# Patient Record
Sex: Female | Born: 1995
Health system: Southern US, Community
[De-identification: ages and names within clinical notes are randomized; demographics above are authoritative.]

## PROBLEM LIST (undated history)

## (undated) DIAGNOSIS — J45909 Unspecified asthma, uncomplicated: Secondary | ICD-10-CM

## (undated) DIAGNOSIS — Z86711 Personal history of pulmonary embolism: Secondary | ICD-10-CM

## (undated) HISTORY — PX: TONSILLECTOMY: SUR1361

## (undated) HISTORY — PX: ADENOIDECTOMY: SUR15

---
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
## 2016-05-26 ENCOUNTER — Encounter (HOSPITAL_BASED_OUTPATIENT_CLINIC_OR_DEPARTMENT_OTHER): Payer: Self-pay | Admitting: Emergency Medicine

## 2016-05-26 ENCOUNTER — Emergency Department (HOSPITAL_BASED_OUTPATIENT_CLINIC_OR_DEPARTMENT_OTHER)
Admission: EM | Admit: 2016-05-26 | Discharge: 2016-05-26 | Disposition: A | Discharge status: Home / Self Care | Payer: Medicaid Other | Attending: Emergency Medicine | Admitting: Emergency Medicine

## 2016-05-26 DIAGNOSIS — N3001 Acute cystitis with hematuria: Secondary | ICD-10-CM | POA: Diagnosis not present

## 2016-05-26 DIAGNOSIS — R319 Hematuria, unspecified: Secondary | ICD-10-CM | POA: Diagnosis present

## 2016-05-26 DIAGNOSIS — J45909 Unspecified asthma, uncomplicated: Secondary | ICD-10-CM | POA: Diagnosis not present

## 2016-05-26 LAB — PREGNANCY, URINE: Preg Test, Ur: NEGATIVE

## 2016-05-26 LAB — URINALYSIS, ROUTINE W REFLEX MICROSCOPIC
Bilirubin Urine: NEGATIVE
Glucose, UA: NEGATIVE mg/dL
Hgb urine dipstick: NEGATIVE
Ketones, ur: NEGATIVE mg/dL
NITRITE: POSITIVE — AB
PH: 5.5 (ref 5.0–8.0)
Protein, ur: NEGATIVE mg/dL
SPECIFIC GRAVITY, URINE: 1.015 (ref 1.005–1.030)

## 2016-05-26 LAB — URINALYSIS, MICROSCOPIC (REFLEX)

## 2016-05-26 MED ORDER — CEPHALEXIN 500 MG PO CAPS: 500.0000 mg | ORAL_CAPSULE | Freq: Two times a day (BID) | ORAL | 0 refills | Status: DC

## 2016-05-26 MED ORDER — CEPHALEXIN 250 MG PO CAPS
500.0000 mg | ORAL_CAPSULE | Freq: Once | ORAL | Status: AC
  Administered 2016-05-26: 500 mg via ORAL
  Filled 2016-05-26: qty 2

## 2016-05-26 NOTE — ED Triage Notes (Signed)
Patient states that she is having blood in her urine since Monday. Reports frequency, started on Azo earlier this week

## 2016-05-26 NOTE — Discharge Instructions (Signed)
You may alternate Tylenol 1000 mg every 6 hours as needed for pain and Ibuprofen 800 mg every 8 hours as needed for pain.  Please take Ibuprofen with food. ° °

## 2016-05-26 NOTE — ED Provider Notes (Signed)
TIME SEEN: 3:02 AM  CHIEF COMPLAINT: Dysuria, hematuria  HPI: Patient is a 21 year old female with history of asthma who presents to the emergency department with dysuria, hematuria that started on May 15. No fevers, chills, nausea, vomiting, diarrhea, flank pain. No history of kidney stones. Has had previous urinary tract infection before. No abnormal vaginal bleeding or discharge. Last menstrual period was April 18.  ROS: See HPI Constitutional: no fever  Eyes: no drainage  ENT: no runny nose   Cardiovascular:  no chest pain  Resp: no SOB  GI: no vomiting GU:  dysuria Integumentary: no rash  Allergy: no hives  Musculoskeletal: no leg swelling  Neurological: no slurred speech ROS otherwise negative  PAST MEDICAL HISTORY/PAST SURGICAL HISTORY:  Past Medical History:  Diagnosis Date  . Asthma     MEDICATIONS:  Prior to Admission medications   Medication Sig Start Date End Date Taking? Authorizing Provider  ibuprofen (ADVIL,MOTRIN) 600 MG tablet Take 1 tablet (600 mg total) by mouth every 6 (six) hours as needed for moderate pain. 11/29/14   Lyndal Pulley, MD  MONONESSA 0.25-35 MG-MCG tablet Take 1 tablet by mouth daily. 11/12/14   [provider]    ALLERGIES:  No Known Allergies  SOCIAL HISTORY:  Social History  Substance Use Topics  . Smoking status: Never Smoker  . Smokeless tobacco: Never Used  . Alcohol use No    FAMILY HISTORY: History reviewed. No pertinent family history.  EXAM: BP 125/64 (BP Location: Left Arm)   Pulse 84   Temp 99.4 F (37.4 C) (Oral)   Resp 18   Ht 5\' 2"  (1.575 m)   Wt 159 lb (72.1 kg)   LMP 03/26/2016   SpO2 100%   BMI 29.08 kg/m  CONSTITUTIONAL: Alert and oriented and responds appropriately to questions. Well-appearing; well-nourished HEAD: Normocephalic EYES: Conjunctivae clear, pupils appear equal, EOMI ENT: normal nose; moist mucous membranes NECK: Supple, no meningismus, no nuchal rigidity, no LAD  CARD: RRR; S1  and S2 appreciated; no murmurs, no clicks, no rubs, no gallops RESP: Normal chest excursion without splinting or tachypnea; breath sounds clear and equal bilaterally; no wheezes, no rhonchi, no rales, no hypoxia or respiratory distress, speaking full sentences ABD/GI: Normal bowel sounds; non-distended; soft, non-tender, no rebound, no guarding, no peritoneal signs, no hepatosplenomegaly BACK:  The back appears normal and is non-tender to palpation, there is no CVA tenderness EXT: Normal ROM in all joints; non-tender to palpation; no edema; normal capillary refill; no cyanosis, no calf tenderness or swelling    SKIN: Normal color for age and race; warm; no rash NEURO: Moves all extremities equally PSYCH: The patient's mood and manner are appropriate. Grooming and personal hygiene are appropriate.  MEDICAL DECISION MAKING: Patient here with urinary tract infection. Urine shows nitrites, large leukocytes, too numerous to count white blood cells and many bacteria with rare squamous cells. Urine culture has been sent. Pregnancy test is negative. We'll discharge with Keflex. She is otherwise extremely well appearing. Doubt kidney stone, pyelonephritis. No other systemic symptoms. Abdominal exam is benign.  No previous urine culture here for [place] to compare to.  At this time, I do not feel there is any life-threatening condition present. I have reviewed and discussed all results (EKG, imaging, lab, urine as appropriate) and exam findings with patient/family. I have reviewed nursing notes and appropriate previous records.  I feel the patient is safe to be discharged home without further emergent workup and can continue workup as an outpatient as  needed. Discussed usual and customary return precautions. Patient/family verbalize understanding and are comfortable with this plan.  Outpatient follow-up has been provided if needed. All questions have been answered.     Ward, Layla MawKristen N, DO 05/26/16 [phone]

## 2016-05-29 LAB — URINE CULTURE

## 2016-05-30 ENCOUNTER — Telehealth: Payer: Self-pay | Admitting: *Deleted

## 2016-05-30 NOTE — Telephone Encounter (Signed)
Post ED Visit - Positive Culture Follow-up  Culture report reviewed by antimicrobial stewardship pharmacist:  []  Enzo BiNathan Batchelder, Pharm.D. [x]  Celedonio MiyamotoJeremy Frens, Pharm.D., BCPS AQ-ID []  Garvin FilaMike Maccia, Pharm.D., BCPS []  Georgina PillionElizabeth Martin, [address]Ph[address]D., BCPS []  PinehavenMinh Pham, [address]Pharm[address]D., BCPS, AAHIVP []  Estella HuskMichelle Turner, Pharm.D., BCPS, AAHIVP []  Lysle Pearlachel Rumbarger, PharmD, BCPS []  Casilda Carlsaylor Stone, PharmD, BCPS []  Pollyann SamplesAndy Johnston, PharmD, BCPS  Positive urine culture Treated with Cephalexin, organism sensitive to the same and no further patient follow-up is required at this time.  Virl AxeRobertson, Silvester Reierson [facility] 05/30/2016, 11:03 AM

## 2016-09-14 ENCOUNTER — Encounter (HOSPITAL_BASED_OUTPATIENT_CLINIC_OR_DEPARTMENT_OTHER): Payer: Self-pay | Admitting: Emergency Medicine

## 2016-09-14 DIAGNOSIS — J45909 Unspecified asthma, uncomplicated: Secondary | ICD-10-CM | POA: Insufficient documentation

## 2016-09-14 DIAGNOSIS — A6004 Herpesviral vulvovaginitis: Secondary | ICD-10-CM | POA: Insufficient documentation

## 2016-09-14 DIAGNOSIS — Z79899 Other long term (current) drug therapy: Secondary | ICD-10-CM | POA: Insufficient documentation

## 2016-09-14 DIAGNOSIS — A6003 Herpesviral cervicitis: Secondary | ICD-10-CM | POA: Diagnosis not present

## 2016-09-14 DIAGNOSIS — R102 Pelvic and perineal pain: Secondary | ICD-10-CM | POA: Diagnosis present

## 2016-09-14 DIAGNOSIS — A5901 Trichomonal vulvovaginitis: Secondary | ICD-10-CM | POA: Diagnosis not present

## 2016-09-14 NOTE — ED Triage Notes (Signed)
Pt presents with c/o vaginal sores that she noticed 2 days ago.

## 2016-09-15 ENCOUNTER — Emergency Department (HOSPITAL_BASED_OUTPATIENT_CLINIC_OR_DEPARTMENT_OTHER)
Admission: EM | Admit: 2016-09-15 | Discharge: 2016-09-15 | Disposition: A | Discharge status: Home / Self Care | Payer: Medicaid Other | Attending: Emergency Medicine | Admitting: Emergency Medicine

## 2016-09-15 DIAGNOSIS — N72 Inflammatory disease of cervix uteri: Secondary | ICD-10-CM

## 2016-09-15 DIAGNOSIS — A6004 Herpesviral vulvovaginitis: Secondary | ICD-10-CM

## 2016-09-15 DIAGNOSIS — A5901 Trichomonal vulvovaginitis: Secondary | ICD-10-CM

## 2016-09-15 LAB — PREGNANCY, URINE: Preg Test, Ur: NEGATIVE

## 2016-09-15 LAB — URINALYSIS, MICROSCOPIC (REFLEX)

## 2016-09-15 LAB — URINALYSIS, ROUTINE W REFLEX MICROSCOPIC
Bilirubin Urine: NEGATIVE
GLUCOSE, UA: NEGATIVE mg/dL
KETONES UR: NEGATIVE mg/dL
Nitrite: NEGATIVE
PH: 7 (ref 5.0–8.0)
Protein, ur: NEGATIVE mg/dL
Specific Gravity, Urine: 1.01 (ref 1.005–1.030)

## 2016-09-15 LAB — WET PREP, GENITAL
Sperm: NONE SEEN
Yeast Wet Prep HPF POC: NONE SEEN

## 2016-09-15 MED ORDER — DOXYCYCLINE HYCLATE 100 MG PO TABS
100.0000 mg | ORAL_TABLET | Freq: Once | ORAL | Status: AC
  Administered 2016-09-15: 100 mg via ORAL
  Filled 2016-09-15: qty 1

## 2016-09-15 MED ORDER — LIDOCAINE HCL (PF) 2 % IJ SOLN
INTRAMUSCULAR | Status: AC
  Filled 2016-09-15: qty 2

## 2016-09-15 MED ORDER — VALACYCLOVIR HCL 1 G PO TABS: 1000.0000 mg | ORAL_TABLET | Freq: Two times a day (BID) | ORAL | 0 refills | Status: AC

## 2016-09-15 MED ORDER — CEFTRIAXONE SODIUM 250 MG IJ SOLR
250.0000 mg | Freq: Once | INTRAMUSCULAR | Status: AC
  Administered 2016-09-15: 250 mg via INTRAMUSCULAR
  Filled 2016-09-15: qty 250

## 2016-09-15 MED ORDER — LIDOCAINE HCL 1 % IJ SOLN
INTRAMUSCULAR | Status: AC
  Filled 2016-09-15: qty 10

## 2016-09-15 MED ORDER — METRONIDAZOLE 500 MG PO TABS
2000.0000 mg | ORAL_TABLET | Freq: Once | ORAL | Status: AC
  Administered 2016-09-15: 2000 mg via ORAL
  Filled 2016-09-15: qty 4

## 2016-09-15 MED ORDER — TRAMADOL HCL 50 MG PO TABS: 50.0000 mg | ORAL_TABLET | Freq: Four times a day (QID) | ORAL | 0 refills | Status: DC | PRN

## 2016-09-15 MED ORDER — VALACYCLOVIR HCL 500 MG PO TABS
1000.0000 mg | ORAL_TABLET | Freq: Once | ORAL | Status: AC
  Administered 2016-09-15: 1000 mg via ORAL
  Filled 2016-09-15: qty 2

## 2016-09-15 MED ORDER — DOXYCYCLINE HYCLATE 100 MG PO CAPS: 100.0000 mg | ORAL_CAPSULE | Freq: Two times a day (BID) | ORAL | 0 refills | Status: DC

## 2016-09-15 NOTE — ED Provider Notes (Signed)
[facility]   CSN: [id number] Arrival date & time: 09/14/16  2300     History   Chief Complaint Chief Complaint  Patient presents with  . Vaginal Pain    HPI Judith Robinson is a 21 y.o. female.  Patient presents to the ER for evaluation of vaginal pain. Patient reports she noticed sores at the entrance to her vagina 2 days ago. She has had a clear discharge as well. Patient reports persistent pain and discomfort. No unusual vaginal bleeding. No pelvic pain.      Past Medical History:  Diagnosis Date  . Asthma     There are no active problems to display for this patient.   History reviewed. No pertinent surgical history.  OB History    No data available       Home Medications    Prior to Admission medications   Medication Sig Start Date End Date Taking? Authorizing Provider  cephALEXin (KEFLEX) 500 MG capsule Take 1 capsule (500 mg total) by mouth 2 (two) times daily. 05/26/16   Ward, Layla MawKristen N, DO  doxycycline (VIBRAMYCIN) 100 MG capsule Take 1 capsule (100 mg total) by mouth 2 (two) times daily. 09/15/16   Gilda CreasePollina, Christopher J, MD  ibuprofen (ADVIL,MOTRIN) 600 MG tablet Take 1 tablet (600 mg total) by mouth every 6 (six) hours as needed for moderate pain. 11/29/14   Lyndal PulleyKnott, Daniel, MD  MONONESSA 0.25-35 MG-MCG tablet Take 1 tablet by mouth daily. 11/12/14   [provider]  traMADol (ULTRAM) 50 MG tablet Take 1 tablet (50 mg total) by mouth every 6 (six) hours as needed. 09/15/16   Gilda CreasePollina, Christopher J, MD  valACYclovir (VALTREX) 1000 MG tablet Take 1 tablet (1,000 mg total) by mouth 2 (two) times daily. 09/15/16 09/29/16  Gilda CreasePollina, Christopher J, MD    Family History No family history on file.  Social History Social History  Substance Use Topics  . Smoking status: Never Smoker  . Smokeless tobacco: Never Used  . Alcohol use No     Allergies   Patient has no known allergies.   Review of Systems Review of Systems    Genitourinary: Positive for vaginal discharge and vaginal pain.  All other systems reviewed and are negative.    Physical Exam Updated Vital Signs BP 122/65 (BP Location: Right Arm)   Pulse (!) 108   Temp 99.1 F (37.3 C) (Oral)   Resp 18   Ht 5\' 2"  (1.575 m)   Wt 71.2 kg (157 lb)   LMP 08/22/2016   SpO2 100%   BMI 28.72 kg/m   Physical Exam  Constitutional: She is oriented to person, place, and time. She appears well-developed and well-nourished. No distress.  HENT:  Head: Normocephalic and atraumatic.  Right Ear: Hearing normal.  Left Ear: Hearing normal.  Nose: Nose normal.  Mouth/Throat: Oropharynx is clear and moist and mucous membranes are normal.  Eyes: Pupils are equal, round, and reactive to light. Conjunctivae and EOM are normal.  Neck: Normal range of motion. Neck supple.  Cardiovascular: Regular rhythm, S1 normal and S2 normal.  Exam reveals no gallop and no friction rub.   No murmur heard. Pulmonary/Chest: Effort normal and breath sounds normal. No respiratory distress. She exhibits no tenderness.  Abdominal: Soft. Normal appearance and bowel sounds are normal. There is no hepatosplenomegaly. There is no tenderness. There is no rebound, no guarding, no tenderness at McBurney's point and negative Murphy's sign. No hernia.  Genitourinary: There is rash (ulceration introitus)  on the right labia. There is rash (ulceration introitus) on the left labia. Cervix exhibits motion tenderness, discharge and friability. Right adnexum displays no tenderness. Left adnexum displays no tenderness. There is erythema in the vagina. Vaginal discharge (copius green, thin) found.  Musculoskeletal: Normal range of motion.  Neurological: She is alert and oriented to person, place, and time. She has normal strength. No cranial nerve deficit or sensory deficit. Coordination normal. GCS eye subscore is 4. GCS verbal subscore is 5. GCS motor subscore is 6.  Skin: Skin is warm, dry and intact.  No rash noted. No cyanosis.  Psychiatric: She has a normal mood and affect. Her speech is normal and behavior is normal. Thought content normal.  Nursing note and vitals reviewed.    ED Treatments / Results  Labs (all labs ordered are listed, but only abnormal results are displayed) Labs Reviewed  WET PREP, GENITAL - Abnormal; Notable for the following:       Result Value   Trich, Wet Prep PRESENT (*)    Clue Cells Wet Prep HPF POC PRESENT (*)    WBC, Wet Prep HPF POC MANY (*)    All other components within normal limits  URINALYSIS, ROUTINE W REFLEX MICROSCOPIC - Abnormal; Notable for the following:    APPearance HAZY (*)    Hgb urine dipstick TRACE (*)    Leukocytes, UA MODERATE (*)    All other components within normal limits  URINALYSIS, MICROSCOPIC (REFLEX) - Abnormal; Notable for the following:    Bacteria, UA MANY (*)    Squamous Epithelial / LPF 0-5 (*)    All other components within normal limits  HSV CULTURE AND TYPING  PREGNANCY, URINE  RPR  HIV ANTIBODY (ROUTINE TESTING)  GC/CHLAMYDIA PROBE AMP ([place]) NOT AT [facility]    EKG  EKG Interpretation None       Radiology No results found.  Procedures Procedures (including critical care time)  Medications Ordered in ED Medications  cefTRIAXone (ROCEPHIN) injection 250 mg (not administered)  doxycycline (VIBRA-TABS) tablet 100 mg (not administered)  metroNIDAZOLE (FLAGYL) tablet 2,000 mg (not administered)  valACYclovir (VALTREX) tablet 1,000 mg (not administered)  lidocaine (XYLOCAINE) 2 % injection (not administered)  lidocaine (XYLOCAINE) 1 % (with pres) injection (not administered)     Initial Impression / Assessment and Plan / ED Course  I have reviewed the triage vital signs and the nursing notes.  Pertinent labs & imaging results that were available during my care of the patient were reviewed by me and considered in my medical decision making (see chart for details).     Presents with  complaints of vaginal pain with external sores. Visualization is consistent with herpes genitalis. She has copious vaginal discharge, Trichomonas positive. Treat empirically for GC and chlamydia as well as trichomonas and herpes.  Final Clinical Impressions(s) / ED Diagnoses   Final diagnoses:  Herpes simplex vulvovaginitis  Cervicitis  Trichomonal vaginitis    New Prescriptions New Prescriptions   DOXYCYCLINE (VIBRAMYCIN) 100 MG CAPSULE    Take 1 capsule (100 mg total) by mouth 2 (two) times daily.   TRAMADOL (ULTRAM) 50 MG TABLET    Take 1 tablet (50 mg total) by mouth every 6 (six) hours as needed.   VALACYCLOVIR (VALTREX) 1000 MG TABLET    Take 1 tablet (1,000 mg total) by mouth 2 (two) times daily.     Gilda Crease, MD 09/15/16 [phone]

## 2016-09-15 NOTE — ED Notes (Signed)
Family at bedside. 

## 2016-09-16 LAB — GC/CHLAMYDIA PROBE AMP (~~LOC~~) NOT AT ARMC
CHLAMYDIA, DNA PROBE: NEGATIVE
NEISSERIA GONORRHEA: NEGATIVE

## 2016-09-16 LAB — HIV ANTIBODY (ROUTINE TESTING W REFLEX): HIV Screen 4th Generation wRfx: NONREACTIVE

## 2016-09-16 LAB — RPR: RPR Ser Ql: NONREACTIVE

## 2016-09-17 LAB — HSV CULTURE AND TYPING

## 2019-07-10 ENCOUNTER — Encounter (HOSPITAL_BASED_OUTPATIENT_CLINIC_OR_DEPARTMENT_OTHER): Payer: Self-pay | Admitting: Emergency Medicine

## 2019-07-10 ENCOUNTER — Observation Stay (HOSPITAL_BASED_OUTPATIENT_CLINIC_OR_DEPARTMENT_OTHER)
Admission: EM | Admit: 2019-07-10 | Discharge: 2019-07-11 | Disposition: A | Discharge status: Home / Self Care | Payer: 59 | Attending: Internal Medicine | Admitting: Internal Medicine

## 2019-07-10 ENCOUNTER — Other Ambulatory Visit: Payer: Self-pay

## 2019-07-10 ENCOUNTER — Emergency Department (HOSPITAL_BASED_OUTPATIENT_CLINIC_OR_DEPARTMENT_OTHER): Payer: 59

## 2019-07-10 DIAGNOSIS — Z20822 Contact with and (suspected) exposure to covid-19: Secondary | ICD-10-CM | POA: Diagnosis not present

## 2019-07-10 DIAGNOSIS — Z86711 Personal history of pulmonary embolism: Secondary | ICD-10-CM | POA: Insufficient documentation

## 2019-07-10 DIAGNOSIS — Z8349 Family history of other endocrine, nutritional and metabolic diseases: Secondary | ICD-10-CM | POA: Insufficient documentation

## 2019-07-10 DIAGNOSIS — T783XXA Angioneurotic edema, initial encounter: Secondary | ICD-10-CM | POA: Diagnosis present

## 2019-07-10 DIAGNOSIS — J45909 Unspecified asthma, uncomplicated: Secondary | ICD-10-CM | POA: Insufficient documentation

## 2019-07-10 DIAGNOSIS — Z820 Family history of epilepsy and other diseases of the nervous system: Secondary | ICD-10-CM | POA: Diagnosis not present

## 2019-07-10 DIAGNOSIS — R609 Edema, unspecified: Secondary | ICD-10-CM

## 2019-07-10 DIAGNOSIS — D509 Iron deficiency anemia, unspecified: Secondary | ICD-10-CM | POA: Diagnosis not present

## 2019-07-10 DIAGNOSIS — R221 Localized swelling, mass and lump, neck: Secondary | ICD-10-CM | POA: Insufficient documentation

## 2019-07-10 DIAGNOSIS — Z7952 Long term (current) use of systemic steroids: Secondary | ICD-10-CM | POA: Insufficient documentation

## 2019-07-10 DIAGNOSIS — Y939 Activity, unspecified: Secondary | ICD-10-CM | POA: Insufficient documentation

## 2019-07-10 DIAGNOSIS — Z79899 Other long term (current) drug therapy: Secondary | ICD-10-CM | POA: Insufficient documentation

## 2019-07-10 DIAGNOSIS — X58XXXA Exposure to other specified factors, initial encounter: Secondary | ICD-10-CM | POA: Insufficient documentation

## 2019-07-10 DIAGNOSIS — Z791 Long term (current) use of non-steroidal anti-inflammatories (NSAID): Secondary | ICD-10-CM | POA: Insufficient documentation

## 2019-07-10 DIAGNOSIS — J90 Pleural effusion, not elsewhere classified: Secondary | ICD-10-CM | POA: Diagnosis not present

## 2019-07-10 DIAGNOSIS — F17290 Nicotine dependence, other tobacco product, uncomplicated: Secondary | ICD-10-CM | POA: Diagnosis not present

## 2019-07-10 HISTORY — DX: Personal history of pulmonary embolism: Z86.711

## 2019-07-10 LAB — CBC WITH DIFFERENTIAL/PLATELET
Abs Immature Granulocytes: 0.01 10*3/uL (ref 0.00–0.07)
Basophils Absolute: 0 10*3/uL (ref 0.0–0.1)
Basophils Relative: 1 %
Eosinophils Absolute: 0.3 10*3/uL (ref 0.0–0.5)
Eosinophils Relative: 6 %
HCT: 34.4 % — ABNORMAL LOW (ref 36.0–46.0)
Hemoglobin: 9.9 g/dL — ABNORMAL LOW (ref 12.0–15.0)
Immature Granulocytes: 0 %
Lymphocytes Relative: 42 %
Lymphs Abs: 2 10*3/uL (ref 0.7–4.0)
MCH: 21.5 pg — ABNORMAL LOW (ref 26.0–34.0)
MCHC: 28.8 g/dL — ABNORMAL LOW (ref 30.0–36.0)
MCV: 74.8 fL — ABNORMAL LOW (ref 80.0–100.0)
Monocytes Absolute: 0.5 10*3/uL (ref 0.1–1.0)
Monocytes Relative: 12 %
Neutro Abs: 1.8 10*3/uL (ref 1.7–7.7)
Neutrophils Relative %: 39 %
Platelets: 387 10*3/uL (ref 150–400)
RBC: 4.6 MIL/uL (ref 3.87–5.11)
RDW: 18.8 % — ABNORMAL HIGH (ref 11.5–15.5)
WBC: 4.6 10*3/uL (ref 4.0–10.5)
nRBC: 0 % (ref 0.0–0.2)

## 2019-07-10 LAB — COMPREHENSIVE METABOLIC PANEL
ALT: 13 U/L (ref 0–44)
AST: 17 U/L (ref 15–41)
Albumin: 4 g/dL (ref 3.5–5.0)
Alkaline Phosphatase: 53 U/L (ref 38–126)
Anion gap: 8 (ref 5–15)
BUN: 9 mg/dL (ref 6–20)
CO2: 26 mmol/L (ref 22–32)
Calcium: 9 mg/dL (ref 8.9–10.3)
Chloride: 105 mmol/L (ref 98–111)
Creatinine, Ser: 0.56 mg/dL (ref 0.44–1.00)
GFR calc Af Amer: >60 mL/min (ref >60)
GFR calc non Af Amer: >60 mL/min (ref >60)
Glucose, Bld: 100 mg/dL — ABNORMAL HIGH (ref 70–99)
Potassium: 3.5 mmol/L (ref 3.5–5.1)
Sodium: 139 mmol/L (ref 135–145)
Total Bilirubin: 0.5 mg/dL (ref 0.3–1.2)
Total Protein: 7.6 g/dL (ref 6.5–8.1)

## 2019-07-10 LAB — PREGNANCY, URINE: Preg Test, Ur: NEGATIVE

## 2019-07-10 LAB — SARS CORONAVIRUS 2 BY RT PCR (HOSPITAL ORDER, PERFORMED IN ~~LOC~~ HOSPITAL LAB): SARS Coronavirus 2: NEGATIVE

## 2019-07-10 LAB — TROPONIN I (HIGH SENSITIVITY)
Troponin I (High Sensitivity): 3 ng/L (ref <18)
Troponin I (High Sensitivity): 3 ng/L (ref <18)

## 2019-07-10 MED ORDER — IBUPROFEN 200 MG PO TABS: 400.0000 mg | ORAL_TABLET | Freq: Four times a day (QID) | ORAL | Status: DC | PRN

## 2019-07-10 MED ORDER — CLINDAMYCIN PHOSPHATE 900 MG/50ML IV SOLN
900.0000 mg | Freq: Once | INTRAVENOUS | Status: AC
  Administered 2019-07-10: 900 mg via INTRAVENOUS
  Filled 2019-07-10: qty 50

## 2019-07-10 MED ORDER — ONDANSETRON HCL 4 MG/2ML IJ SOLN: 4.0000 mg | Freq: Four times a day (QID) | INTRAMUSCULAR | Status: DC | PRN

## 2019-07-10 MED ORDER — OXYCODONE HCL 5 MG PO TABS
5.0000 mg | ORAL_TABLET | ORAL | Status: DC | PRN
  Administered 2019-07-11: 5 mg via ORAL
  Filled 2019-07-10: qty 1

## 2019-07-10 MED ORDER — CLINDAMYCIN HCL 300 MG PO CAPS: 600.0000 mg | ORAL_CAPSULE | Freq: Four times a day (QID) | ORAL | Status: DC

## 2019-07-10 MED ORDER — ONDANSETRON HCL 4 MG PO TABS: 4.0000 mg | ORAL_TABLET | Freq: Four times a day (QID) | ORAL | Status: DC | PRN

## 2019-07-10 MED ORDER — DEXAMETHASONE 4 MG PO TABS: 10.0000 mg | ORAL_TABLET | Freq: Two times a day (BID) | ORAL | Status: DC

## 2019-07-10 MED ORDER — DEXAMETHASONE SODIUM PHOSPHATE 10 MG/ML IJ SOLN
20.0000 mg | Freq: Once | INTRAMUSCULAR | Status: AC
  Administered 2019-07-10: 20 mg via INTRAVENOUS
  Filled 2019-07-10: qty 2

## 2019-07-10 MED ORDER — CLINDAMYCIN PHOSPHATE 600 MG/50ML IV SOLN
600.0000 mg | Freq: Four times a day (QID) | INTRAVENOUS | Status: DC
  Administered 2019-07-10 – 2019-07-11 (×4): 600 mg via INTRAVENOUS
  Filled 2019-07-10 (×5): qty 50

## 2019-07-10 MED ORDER — DEXAMETHASONE SODIUM PHOSPHATE 10 MG/ML IJ SOLN
10.0000 mg | Freq: Two times a day (BID) | INTRAMUSCULAR | Status: DC
  Administered 2019-07-11 (×2): 10 mg via INTRAVENOUS
  Filled 2019-07-10 (×2): qty 1

## 2019-07-10 MED ORDER — IOHEXOL 350 MG/ML SOLN
100.0000 mL | Freq: Once | INTRAVENOUS | Status: AC | PRN
  Administered 2019-07-10: 100 mL via INTRAVENOUS

## 2019-07-10 MED ORDER — SODIUM CHLORIDE 0.9% FLUSH
3.0000 mL | Freq: Two times a day (BID) | INTRAVENOUS | Status: DC
  Administered 2019-07-10 – 2019-07-11 (×2): 3 mL via INTRAVENOUS

## 2019-07-10 NOTE — H&P (Signed)
History and Physical    Judith Robinson BDZ:[id number] DOB: 1995/10/21 DOA: 07/10/2019  PCP: [facility] Patient coming from: [facility]  Chief Complaint: left sided neck swelling  HPI: Judith Robinson is a 24 y.o. female with medical history significant of asthma and PE (no longer on [facility]) who presented for left sided neck swelling.  She notes that she woke with this this morning.  Also with discomfort which is achy and worse when she moves her neck.  She has no tooth drainage or pain, no issues on the interior of her mouth.  The area of swelling does not move into the head or around the ear.  She does not remember a bit and has no puncture marks.  She has not been stung.  She is not aware of any food allergies and did not eat anything out of the ordinary.  No new soaps or shampoos, no rash.  [place] and might have been exposed to something in the water.  She did note that it hurt to take a deep breath in, but no cough or other pulmonary issues.   ED Course: In the ED, she was noted microcytic anemia with an MCV of 74.  Otherwise, blood work was normal.  Blood cultures were drawn.  CT of the chest and neck showed no PE.  She had generalized swelling of the left neck which extended into the mediastinum.  She had minimal pleural effusions and patchy, hazy ground glass opacities in both lungs.  ENT (Dr. Suszanne Conners) and CTS (Dr. Morton Peters) were consulted and will see her now that she is at [facility].   Review of Systems: As per HPI otherwise all other systems reviewed and are negative.   Past Medical History:  Diagnosis Date  . Asthma   . History of pulmonary embolism     Past Surgical History:  Procedure Laterality Date  . ADENOIDECTOMY    . TONSILLECTOMY      Social History  reports that she has been smoking e-cigarettes. She has never used smokeless tobacco. She reports that she does not drink alcohol and does not use drugs.  No Known  Allergies  Family History  Problem Relation Age of Onset  . Multiple sclerosis Mother   . Thyroid disease Father        unknown     Prior to Admission medications   Medication Sig Start Date End Date Taking? Authorizing Provider  Albuterol inhaler, 1-2 times per month.                                      Physical Exam: Vitals:   07/10/19 1317 07/10/19 1330 07/10/19 1608 07/10/19 1720  BP: 114/77 120/84 112/73 127/79  Pulse: 82 69 95 89  Resp: 18 16 20 18   Temp:   98.5 F (36.9 C) 98.1 F (36.7 C)  TempSrc:   Oral   SpO2: 100% 100% 100% 99%  Weight: 84.4 kg     Height: 5\' 2"  (1.575 m)       Constitutional: NAD, calm, comfortable, sitting in bed.  Eyes: =lids and conjunctivae normal ENMT: Mucous membranes are moist. Posterior pharynx clear of any exudate or lesions. Normal dentition she has some fillings.  No drainage or mass in mouth.   Neck: She has swelling and tenderness to the left collarbone area to the midline,  no crepitus.  She has no TTP over the parotid or mastoid.  She has no ear drainage.   Respiratory: CTAB, no wheezing Cardiovascular: RR, NR, no murmur.  No swelling of LUE, normal pulses in the radial arteries bilaterally Abdomen: +BS, NT, ND Musculoskeletal: no clubbing / cyanosis. No contractures Skin: no rashes, lesions, ulcers on exposed skin.  Tattoo on mid back Neurologic: Grossly intact, speech intact Psychiatric: Normal judgment and insight. Alert and oriented x 3. Normal mood.    Labs on Admission: I have personally reviewed following labs and imaging studies  CBC: Recent Labs  Lab 07/10/19 1056  WBC 4.6  NEUTROABS 1.8  HGB 9.9*  HCT 34.4*  MCV 74.8*  PLT 387    Basic Metabolic Panel: Recent Labs  Lab 07/10/19 1056  NA 139  K 3.5  CL 105  CO2 26  GLUCOSE 100*  BUN 9  CREATININE 0.56  CALCIUM 9.0    GFR: Estimated Creatinine Clearance: 111.1 mL/min (by C-G formula based on SCr of 0.56 mg/dL).  Liver Function  Tests: Recent Labs  Lab 07/10/19 1056  AST 17  ALT 13  ALKPHOS 53  BILITOT 0.5  PROT 7.6  ALBUMIN 4.0    Urine analysis:    Component Value Date/Time   COLORURINE YELLOW 09/15/2016 0000   APPEARANCEUR HAZY (A) 09/15/2016 0000   LABSPEC 1.010 09/15/2016 0000   PHURINE 7.0 09/15/2016 0000   GLUCOSEU NEGATIVE 09/15/2016 0000   HGBUR TRACE (A) 09/15/2016 0000   BILIRUBINUR NEGATIVE 09/15/2016 0000   KETONESUR NEGATIVE 09/15/2016 0000   PROTEINUR NEGATIVE 09/15/2016 0000   NITRITE NEGATIVE 09/15/2016 0000   LEUKOCYTESUR MODERATE (A) 09/15/2016 0000    Radiological Exams on Admission: CT Soft Tissue Neck W Contrast  Addendum Date: 07/10/2019   ADDENDUM REPORT: 07/10/2019 13:13 ADDENDUM: Study discussed by telephone with Dr. Alvira Monday on 07/10/2019 at 1255 hours. My colleague Dr. Juel Burrow on her Chest CTA read raised the interesting possibility of a left upper extremity venous thrombosis, but these neck CT images confirm patency of the left IJ, the left subclavian vein, visible left axillary vein, and other central veins. And after further discussion with Dr. Dalene Seltzer regarding patient presentation and all clinical features the etiology of the extensive soft tissue edema is unclear. There is no white count or fever. And reportedly the patient takes no medication which could have led to a reaction or toxicity. Still, the entire constellation seems most compatible with a spontaneous angioedema although the location and extent are very unusual. I am happy to discuss further with ENT also should they have any additional ideas. Electronically Signed   By: Odessa Fleming M.D.   On: 07/10/2019 13:13   Result Date: 07/10/2019 CLINICAL DATA:  24 year old female with left neck swelling since waking today. Query Lemierre syndrome. No known injury. History of pulmonary embolus 2 years ago but not on blood thinners at this time. EXAM: CT NECK WITH CONTRAST TECHNIQUE: Multidetector CT imaging of the neck was  performed using the standard protocol following the bolus administration of intravenous contrast. CONTRAST:  OMNIPAQUE IOHEXOL 350 MG/ML SOLN COMPARISON:  CTA chest today reported separately. FINDINGS: Pharynx and larynx: Laryngeal and pharyngeal soft tissue contours remain within normal limits, with no tonsillar or mucosal space hyperenhancement. However, there is conspicuous retropharyngeal fluid or edema without rim enhancement. In the neck this is most pronounced from the C2 through C4 cervical levels (sagittal image 82) although is contiguous with abundant left parapharyngeal, carotid and other deep  soft tissue space edema (series 3, image 68). However, the superior parapharyngeal space on the left remains normal. The right parapharyngeal and deep soft tissue spaces are normal. Salivary glands: Sublingual, submandibular and parotid spaces are spared and appear normal. Thyroid: Thyroid size and enhancement appears within normal limits. There are small enhancing thyroidal veins communicating with the left lower IJ, no discrete thyroid lesion. Lymph nodes: Despite the widespread deep space and trans spatial edema in the left neck (series 3, image 57) there is no associated lymphadenopathy. No enlarged or heterogeneous lymph nodes are identified. Vascular: The left IJ is patent and appears normal throughout its course. The left CCA, left ICA and external carotid also appear normal despite the soft tissue edema or fluid surrounding most of the left carotid space in the neck. Contralateral right neck and other major vascular structures appear patent and normal to the skull base. Limited intracranial: Negative. Visualized orbits: Negative. Mastoids and visualized paranasal sinuses: Clear aside from trace bubbly opacity in the right sphenoid sinus. Tympanic cavities and mastoids are clear. Skeleton: No dental abnormality identified. Normal CT appearance of the cervical spine. There are no dystrophic  calcifications of the longus coli muscles. No osseous abnormality identified. Upper chest: Dedicated chest CTA is reported separately, but note that it appears the widespread deep space soft tissue edema in the left neck also continues into the mediastinum on series 3, image 92. There is mild prevascular space mass effect, but no narrowing of the trachea. Unremarkable visible esophagus. IMPRESSION: Extensive, though nonspecific soft tissue edema and/or fluid tracking throughout the deep spaces of the left lower neck and into the visible mediastinum. There is retropharyngeal space involvement also, although most pronounced in the upper cervical levels (C2 through C3). Negative for Lemierre syndrome, with patent and normal left IJ and left carotid despite edema involving the left carotid space. None of the soft tissue fluid is rim enhancing to suggest abscess. And there is no clear source of this inflammation (e.g. no evidence of tonsillitis, no left thyroid region which might have ruptured, etc). In the lower neck the epicenter of the edema is more supraclavicular than central. Perhaps this is an unusual presentation of Angioedema, such as due to an allergic reaction. If there is neck pain and fever then a Cervical Spine MRI (without and with contrast) would be complementary to exclude an underlying discitis . Also recommend correlation with Chest CTA findings reported separately. Electronically Signed: By: Odessa FlemingH  Hall M.D. On: 07/10/2019 12:21   CT Angio Chest PE W and/or Wo Contrast  Result Date: 07/10/2019 CLINICAL DATA:  Assess for pulmonary embolus. EXAM: CT ANGIOGRAPHY CHEST WITH CONTRAST TECHNIQUE: Multidetector CT imaging of the chest was performed using the standard protocol during bolus administration of intravenous contrast. Multiplanar CT image reconstructions and MIPs were obtained to evaluate the vascular anatomy. CONTRAST:  100mL OMNIPAQUE IOHEXOL 350 MG/ML SOLN COMPARISON:  None. FINDINGS:  Cardiovascular: There is no pulmonary embolus. The aorta is normal. The heart size is normal. There is no pericardial effusion. The study is imaged in the arterial phase therefore there is limited assessment of the venous systems. Mediastinum/Nodes: There is generalized swelling edema of the left neck extend in to the mediastinum. The trachea and esophagus are normal. No definite mediastinal or hilar lymphadenopathy is identified. Lungs/Pleura: Minimal bilateral pleural effusions are identified. Mild atelectasis of lung bases are noted. Minimal patchy hazy ground-glass opacities identified in the bilateral lungs. Upper Abdomen: No acute abnormality. Musculoskeletal: No chest wall abnormality. No  acute or significant osseous findings. Review of the MIP images confirms the above findings. IMPRESSION: 1. No pulmonary embolus. 2. Generalized swelling edema of the left neck extend in to the mediastinum. This study is image in the arterial phase therefore there is limited assessment of the venous systems of the neck and mediastinum. Recommend further evaluation with ultrasound of the upper extremity and neck to out venous thrombosis. 3. Minimal bilateral pleural effusions with mild atelectasis of lung bases. 4. Minimal patchy hazy ground-glass opacities identified in the bilateral lungs. This is nonspecific but can be seen in atypical infection. These results will be called to the ordering clinician or representative by the Radiologist Assistant, and communication documented in the PACS or [employer]. Electronically Signed   By: Sherian Rein M.D.   On: 07/10/2019 12:22     Assessment/Plan  Neck swelling - Discussed with Dr. Suszanne Conners - unclear cause, treat with Abx and steroids for concern for possible infection.  He will see over the weekend if she remains in house - Dexamethasone 20mg  IV given - Dexamethasone 10mg  IV BID - Clindamycin 900mg  IV given - Clindamycin 600mg  IV q 6 hours - Monitor airway,  monitor for improvement  Microcytic Anemia - Check iron studies - consider iron infusion if low  Asthma - PRN albuterol inhaler    DVT prophylaxis: SCDs (patient preference)  Code Status:   Full  Disposition Plan:   Patient is from:  Home  Anticipated DC to:  Home  Anticipated DC date:  07/11/19 if improving  Anticipated DC barriers: Medical improvement  Consults called:  [place], ENT - will see over the weekend if remains in house.  Admission status:  Obs, Med Surg   Severity of Illness: The appropriate patient status for this patient is OBSERVATION. Observation status is judged to be reasonable and necessary in order to provide the required intensity of service to ensure the patient's safety. The patient's presenting symptoms, physical exam findings, and initial radiographic and laboratory data in the context of their medical condition is felt to place them at decreased risk for further clinical deterioration. Furthermore, it is anticipated that the patient will be medically stable for discharge from the hospital within 2 midnights of admission. The following factors support the patient status of observation.   " The patient's presenting symptoms include neck swelling. " The physical exam findings include neck swelling. " The initial radiographic and laboratory data are swelling of the neck to mediastinum.      MD [facility]  How to contact the [facility] Attending or Consulting provider 7A - 7P or covering provider during after hours 7P -7A, for this patient?   1. Check the care team in [facility] and look for a) attending/consulting [facility] provider listed and b) the [facility] team listed 2. Log into [email or web address] and use [place]'s universal password to access. If you do not have the password, please contact the hospital operator. 3. Locate the [facility] provider you are looking for under [facility] and page to a number that you can be directly reached. 4. If you still have  difficulty reaching the provider, please page the [facility] (Director on Call) for the Hospitalists listed on amion for assistance.  07/10/2019, 6:12 PM

## 2019-07-10 NOTE — ED Notes (Signed)
ED Provider at bedside. 

## 2019-07-10 NOTE — Consult Note (Signed)
Reason for Consult: Neck swelling Referring Physician: Alvira Monday, MD  HPI:  Judith Robinson is an 24 y.o. female who noted an acute onset neck discomfort and left lower neck swelling this morning. Her pain is worse when she moves her neck. She denies any dysphagia or odynophagia. No respiratory difficulty. She is not aware of any food allergies and did not eat anything out of the ordinary. She did note that it hurt to take a deep breath in, but no cough or other pulmonary issues.  CT of the chest and neck in the ED showed no PE.  She had generalized swelling of the left neck which extended into the mediastinum.  She had minimal pleural effusions and patchy, hazy ground glass opacities in both lungs.    Past Medical History:  Diagnosis Date  . Asthma   . History of pulmonary embolism     Past Surgical History:  Procedure Laterality Date  . ADENOIDECTOMY    . TONSILLECTOMY      Family History  Problem Relation Age of Onset  . Multiple sclerosis Mother   . Thyroid disease Father        unknown    Social History:  reports that she has been smoking e-cigarettes. She has never used smokeless tobacco. She reports that she does not drink alcohol and does not use drugs.  Allergies: No Known Allergies  Prior to Admission medications   Medication Sig Start Date End Date Taking? Authorizing Provider  cephALEXin (KEFLEX) 500 MG capsule Take 1 capsule (500 mg total) by mouth 2 (two) times daily. Patient not taking: Reported on 07/10/2019 05/26/16   Ward, Layla Maw, DO  doxycycline (VIBRAMYCIN) 100 MG capsule Take 1 capsule (100 mg total) by mouth 2 (two) times daily. Patient not taking: Reported on 07/10/2019 09/15/16   Gilda Crease, MD  ibuprofen (ADVIL,MOTRIN) 600 MG tablet Take 1 tablet (600 mg total) by mouth every 6 (six) hours as needed for moderate pain. Patient not taking: Reported on 07/10/2019 11/29/14   Lyndal Pulley, MD  MONONESSA 0.25-35 MG-MCG tablet Take 1 tablet by mouth  daily. Patient not taking: Reported on 07/10/2019 11/12/14   [provider]  traMADol (ULTRAM) 50 MG tablet Take 1 tablet (50 mg total) by mouth every 6 (six) hours as needed. Patient not taking: Reported on 07/10/2019 09/15/16   Gilda Crease, MD    Medications:  I have reviewed the patient's current medications. Scheduled: . [START ON 07/11/2019] dexamethasone (DECADRON) injection  10 mg Intravenous Q12H  . sodium chloride flush  3 mL Intravenous Q12H   Continuous: . clindamycin (CLEOCIN) IV 600 mg (07/10/19 1955)    Results for orders placed or performed during the hospital encounter of 07/10/19 (from the past 48 hour(s))  Pregnancy, urine     Status: None   Collection Time: 07/10/19 10:55 AM  Result Value Ref Range   Preg Test, Ur NEGATIVE NEGATIVE    Comment:        THE SENSITIVITY OF THIS METHODOLOGY IS >20 mIU/mL. Performed at [facility], [address] [place], [place] [address]   CBC with Differential     Status: Abnormal   Collection Time: 07/10/19 10:56 AM  Result Value Ref Range   WBC 4.6 4.0 - 10.5 K/uL   RBC 4.60 3.87 - 5.11 MIL/uL   Hemoglobin 9.9 (L) 12.0 - 15.0 g/dL   HCT 60.4 (L) 36 - 46 %   MCV 74.8 (L) 80.0 - 100.0 fL  MCH 21.5 (L) 26.0 - 34.0 pg   MCHC 28.8 (L) 30.0 - 36.0 g/dL   RDW 16.118.8 (H) 09.611.5 - 04.515.5 %   Platelets 387 150 - 400 K/uL   nRBC 0.0 0.0 - 0.2 %   Neutrophils Relative % 39 %   Neutro Abs 1.8 1.7 - 7.7 K/uL   Lymphocytes Relative 42 %   Lymphs Abs 2.0 0.7 - 4.0 K/uL   Monocytes Relative 12 %   Monocytes Absolute 0.5 0 - 1 K/uL   Eosinophils Relative 6 %   Eosinophils Absolute 0.3 0 - 0 K/uL   Basophils Relative 1 %   Basophils Absolute 0.0 0 - 0 K/uL   Immature Granulocytes 0 %   Abs Immature Granulocytes 0.01 0.00 - 0.07 K/uL    Comment: Performed at [facility] [place], [address] [place], [place] [address]  Comprehensive metabolic panel     Status: Abnormal   Collection Time: 07/10/19 10:56 AM   Result Value Ref Range   Sodium 139 135 - 145 mmol/L   Potassium 3.5 3.5 - 5.1 mmol/L   Chloride 105 98 - 111 mmol/L   CO2 26 22 - 32 mmol/L   Glucose, Bld 100 (H) 70 - 99 mg/dL    Comment: Glucose reference range applies only to samples taken after fasting for at least 8 hours.   BUN 9 6 - 20 mg/dL   Creatinine, Ser 1.910.56 0.44 - 1.00 mg/dL   Calcium 9.0 8.9 - 47.810.3 mg/dL   Total Protein 7.6 6.5 - 8.1 g/dL   Albumin 4.0 3.5 - 5.0 g/dL   AST 17 15 - 41 U/L   ALT 13 0 - 44 U/L   Alkaline Phosphatase 53 38 - 126 U/L   Total Bilirubin 0.5 0.3 - 1.2 mg/dL   GFR calc non Af Amer >60 >60 mL/min   GFR calc Af Amer >60 >60 mL/min   Anion gap 8 5 - 15    Comment: Performed at [facility] [place], [address] [place], [place] [address]  Troponin I (High Sensitivity)     Status: None   Collection Time: 07/10/19 10:56 AM  Result Value Ref Range   Troponin I (High Sensitivity) 3 <18 ng/L    Comment: (NOTE) Elevated high sensitivity troponin I (hsTnI) values and significant  changes across serial measurements may suggest ACS but many other  chronic and acute conditions are known to elevate hsTnI results.  Refer to the "Links" section for chest pain algorithms and additional  guidance. Performed at [facility] [place], [address] [place], [place] [address]   Troponin I (High Sensitivity)     Status: None   Collection Time: 07/10/19 12:35 PM  Result Value Ref Range   Troponin I (High Sensitivity) 3 <18 ng/L    Comment: (NOTE) Elevated high sensitivity troponin I (hsTnI) values and significant  changes across serial measurements may suggest ACS but many other  chronic and acute conditions are known to elevate hsTnI results.  Refer to the "Links" section for chest pain algorithms and additional  guidance. Performed at [facility] [place], [address] [place], [place] [address]   SARS Coronavirus 2 by RT PCR (hospital order, performed in [facility] lab)  Nasopharyngeal Nasopharyngeal Swab     Status: None   Collection Time: 07/10/19  1:48 PM   Specimen: Nasopharyngeal Swab  Result Value Ref Range   SARS Coronavirus 2 NEGATIVE NEGATIVE  Comment: (NOTE) SARS-CoV-2 target nucleic acids are NOT DETECTED.  The SARS-CoV-2 RNA is generally detectable in upper and lower respiratory specimens during the acute phase of infection. The lowest concentration of SARS-CoV-2 viral copies this assay can detect is 250 copies / mL. A negative result does not preclude SARS-CoV-2 infection and should not be used as the sole basis for treatment or other patient management decisions.  A negative result may occur with improper specimen collection / handling, submission of specimen other than nasopharyngeal swab, presence of viral mutation(s) within the areas targeted by this assay, and inadequate number of viral copies (<250 copies / mL). A negative result must be combined with clinical observations, patient history, and epidemiological information.  Fact Sheet for Patients:   BoilerBrush.com.cy  Fact Sheet for Healthcare Providers: https://pope.com/  This test is not yet approved or  cleared by the [place] FDA and has been authorized for detection and/or diagnosis of SARS-CoV-2 by FDA under an Emergency Use Authorization (EUA).  This EUA will remain in effect (meaning this test can be used) for the duration of the COVID-19 declaration under Section 564(b)(1) of the Act, 21 U.S.C. section 360bbb-3(b)(1), unless the authorization is terminated or revoked sooner.  Performed at [facility], [address], [place], [place] [address]     CT Soft Tissue Neck W Contrast  Addendum Date: 07/10/2019   ADDENDUM REPORT: 07/10/2019 13:13 ADDENDUM: Study discussed by telephone with Dr. Alvira Monday on 07/10/2019 at 1255 hours. My colleague Dr. Juel Burrow on her Chest CTA read raised the interesting  possibility of a left upper extremity venous thrombosis, but these neck CT images confirm patency of the left IJ, the left subclavian vein, visible left axillary vein, and other central veins. And after further discussion with Dr. Dalene Seltzer regarding patient presentation and all clinical features the etiology of the extensive soft tissue edema is unclear. There is no white count or fever. And reportedly the patient takes no medication which could have led to a reaction or toxicity. Still, the entire constellation seems most compatible with a spontaneous angioedema although the location and extent are very unusual. I am happy to discuss further with ENT also should they have any additional ideas. Electronically Signed   By: Odessa Fleming M.D.   On: 07/10/2019 13:13   Result Date: 07/10/2019 CLINICAL DATA:  24 year old female with left neck swelling since waking today. Query Lemierre syndrome. No known injury. History of pulmonary embolus 2 years ago but not on blood thinners at this time. EXAM: CT NECK WITH CONTRAST TECHNIQUE: Multidetector CT imaging of the neck was performed using the standard protocol following the bolus administration of intravenous contrast. CONTRAST:  OMNIPAQUE IOHEXOL 350 MG/ML SOLN COMPARISON:  CTA chest today reported separately. FINDINGS: Pharynx and larynx: Laryngeal and pharyngeal soft tissue contours remain within normal limits, with no tonsillar or mucosal space hyperenhancement. However, there is conspicuous retropharyngeal fluid or edema without rim enhancement. In the neck this is most pronounced from the C2 through C4 cervical levels (sagittal image 82) although is contiguous with abundant left parapharyngeal, carotid and other deep soft tissue space edema (series 3, image 68). However, the superior parapharyngeal space on the left remains normal. The right parapharyngeal and deep soft tissue spaces are normal. Salivary glands: Sublingual, submandibular and parotid spaces are  spared and appear normal. Thyroid: Thyroid size and enhancement appears within normal limits. There are small enhancing thyroidal veins communicating with the left lower IJ, no discrete thyroid lesion.  Lymph nodes: Despite the widespread deep space and trans spatial edema in the left neck (series 3, image 57) there is no associated lymphadenopathy. No enlarged or heterogeneous lymph nodes are identified. Vascular: The left IJ is patent and appears normal throughout its course. The left CCA, left ICA and external carotid also appear normal despite the soft tissue edema or fluid surrounding most of the left carotid space in the neck. Contralateral right neck and other major vascular structures appear patent and normal to the skull base. Limited intracranial: Negative. Visualized orbits: Negative. Mastoids and visualized paranasal sinuses: Clear aside from trace bubbly opacity in the right sphenoid sinus. Tympanic cavities and mastoids are clear. Skeleton: No dental abnormality identified. Normal CT appearance of the cervical spine. There are no dystrophic calcifications of the longus coli muscles. No osseous abnormality identified. Upper chest: Dedicated chest CTA is reported separately, but note that it appears the widespread deep space soft tissue edema in the left neck also continues into the mediastinum on series 3, image 92. There is mild prevascular space mass effect, but no narrowing of the trachea. Unremarkable visible esophagus. IMPRESSION: Extensive, though nonspecific soft tissue edema and/or fluid tracking throughout the deep spaces of the left lower neck and into the visible mediastinum. There is retropharyngeal space involvement also, although most pronounced in the upper cervical levels (C2 through C3). Negative for Lemierre syndrome, with patent and normal left IJ and left carotid despite edema involving the left carotid space. None of the soft tissue fluid is rim enhancing to suggest abscess. And  there is no clear source of this inflammation (e.g. no evidence of tonsillitis, no left thyroid region which might have ruptured, etc). In the lower neck the epicenter of the edema is more supraclavicular than central. Perhaps this is an unusual presentation of Angioedema, such as due to an allergic reaction. If there is neck pain and fever then a Cervical Spine MRI (without and with contrast) would be complementary to exclude an underlying discitis . Also recommend correlation with Chest CTA findings reported separately. Electronically Signed: By: Odessa Fleming M.D. On: 07/10/2019 12:21   CT Angio Chest PE W and/or Wo Contrast  Result Date: 07/10/2019 CLINICAL DATA:  Assess for pulmonary embolus. EXAM: CT ANGIOGRAPHY CHEST WITH CONTRAST TECHNIQUE: Multidetector CT imaging of the chest was performed using the standard protocol during bolus administration of intravenous contrast. Multiplanar CT image reconstructions and MIPs were obtained to evaluate the vascular anatomy. CONTRAST:  OMNIPAQUE IOHEXOL 350 MG/ML SOLN COMPARISON:  None. FINDINGS: Cardiovascular: There is no pulmonary embolus. The aorta is normal. The heart size is normal. There is no pericardial effusion. The study is imaged in the arterial phase therefore there is limited assessment of the venous systems. Mediastinum/Nodes: There is generalized swelling edema of the left neck extend in to the mediastinum. The trachea and esophagus are normal. No definite mediastinal or hilar lymphadenopathy is identified. Lungs/Pleura: Minimal bilateral pleural effusions are identified. Mild atelectasis of lung bases are noted. Minimal patchy hazy ground-glass opacities identified in the bilateral lungs. Upper Abdomen: No acute abnormality. Musculoskeletal: No chest wall abnormality. No acute or significant osseous findings. Review of the MIP images confirms the above findings. IMPRESSION: 1. No pulmonary embolus. 2. Generalized swelling edema of the left neck  extend in to the mediastinum. This study is image in the arterial phase therefore there is limited assessment of the venous systems of the neck and mediastinum. Recommend further evaluation with ultrasound of the upper extremity and neck  to out venous thrombosis. 3. Minimal bilateral pleural effusions with mild atelectasis of lung bases. 4. Minimal patchy hazy ground-glass opacities identified in the bilateral lungs. This is nonspecific but can be seen in atypical infection. These results will be called to the ordering clinician or representative by the Radiologist Assistant, and communication documented in the PACS or [employer]. Electronically Signed   By: Sherian Rein M.D.   On: 07/10/2019 12:22   Review of Systems  Constitutional: Negative for fever.  HENT: Positive for congestion. Negative for sore throat.   Eyes: Negative for visual disturbance.  Respiratory: Positive for shortness of breath. Negative for cough.   Cardiovascular: Positive for chest pain.  Gastrointestinal: Negative for abdominal pain, nausea and vomiting.  Genitourinary: Negative for difficulty urinating.  Musculoskeletal: Positive for neck pain. Negative for back pain.  Skin: Negative for rash.  Neurological: Negative for syncope and headaches.   Blood pressure 115/73, pulse 80, temperature 98.1 F (36.7 C), temperature source Oral, resp. rate 16, height 5\' 2"  (1.575 m), weight 84.4 kg, SpO2 98 %. Physical Exam: General: A&Ox3. No acute distress. Eyes: Pupils are equal, round, reactive to light. Extraocular motion is intact.  Ears: Examination of the ears shows normal auricles and external auditory canals bilaterally. Both tympanic membranes are intact.  Nose: Nasal examination shows normal mucosa, septum, turbinates.  Face: Facial examination shows no asymmetry. Palpation of the face elicit no significant tenderness.  Mouth: Oral cavity examination shows no mucosal lacerations. No significant trismus is noted.   Neck: Palpation of the neck reveals no lymphadenopathy or mass. The trachea is midline. The thyroid is not significantly enlarged. The left lower neck is diffusely edematous. Neuro: Cranial nerves 2-12 are all grossly in tact.  Assessment/Plan: Left lower neck and mediastinum soft tissue edema of unknown etiology. - No airway issue at this time. - No obvious ENT infection on exam. - Continue with decadron and clindamycin. - Will follow clinically.  Alizzon Dioguardi W Verdella Laidlaw 07/10/2019, 11:51 PM

## 2019-07-10 NOTE — ED Notes (Signed)
Attempted report.  Nurse unavailable.  Will try back in 10 minutes.

## 2019-07-10 NOTE — ED Notes (Signed)
Hospitalist consult called via [facility], spoke with Cala Bradford.

## 2019-07-10 NOTE — ED Triage Notes (Signed)
Pt here with left neck swelling down into clavicle area since waking up this morning. No injury occurred. States she can feel the swelling pushing into her throat.

## 2019-07-10 NOTE — ED Provider Notes (Signed)
[facility] EMERGENCY DEPARTMENT Provider Note   CSN: [id number] Arrival date & time: 07/10/19  [id number]     History Chief Complaint  Patient presents with  . Neck swelling    Judith Robinson is a 24 y.o. female.  HPI      24 year old female with a history of asthma, pulmonary embolus no longer on anticoagulation, presents with concern for neck swelling and pain as well as chest pain and shortness of breath that began this morning.  Reports woke up this morning with a sensation of swelling, and mild discomfort in the neck that she describes as an ache.  Reports it is worse when she moves her neck.  Reports she has chronic sinus disease related to allergies, but denies fevers, sore throat.  Reports sensation that the swelling of the left side is pressing on her throat, with some increased difficulty swallowing, mild change in voice.  Reports she has dyspnea-both with sensation from throat, but also related to pain that increases with deep breaths in her chest.  No drooling.  Was on OCPs at time of PE previously. Denies IVDU, other drugs or medications with mother out of room for questioning.  Past Medical History:  Diagnosis Date  . Asthma     Patient Active Problem List   Diagnosis Date Noted  . Angioedema 07/10/2019    Past Surgical History:  Procedure Laterality Date  . TONSILLECTOMY       OB History   No obstetric history on file.     History reviewed. No pertinent family history.  Social History   Tobacco Use  . Smoking status: Never Smoker  . Smokeless tobacco: Never Used  Vaping Use  . Vaping Use: Never used  Substance Use Topics  . Alcohol use: No  . Drug use: No    Home Medications Prior to Admission medications   Medication Sig Start Date End Date Taking? Authorizing Provider  cephALEXin (KEFLEX) 500 MG capsule Take 1 capsule (500 mg total) by mouth 2 (two) times daily. 05/26/16   Ward, Layla MawKristen N, DO  doxycycline (VIBRAMYCIN) 100 MG capsule  Take 1 capsule (100 mg total) by mouth 2 (two) times daily. 09/15/16   Gilda CreasePollina, Christopher J, MD  ibuprofen (ADVIL,MOTRIN) 600 MG tablet Take 1 tablet (600 mg total) by mouth every 6 (six) hours as needed for moderate pain. 11/29/14   Lyndal PulleyKnott, Daniel, MD  MONONESSA 0.25-35 MG-MCG tablet Take 1 tablet by mouth daily. 11/12/14   [provider]  traMADol (ULTRAM) 50 MG tablet Take 1 tablet (50 mg total) by mouth every 6 (six) hours as needed. 09/15/16   Gilda CreasePollina, Christopher J, MD    Allergies    Patient has no known allergies.  Review of Systems   Review of Systems  Constitutional: Negative for fever.  HENT: Positive for congestion. Negative for sore throat.   Eyes: Negative for visual disturbance.  Respiratory: Positive for shortness of breath. Negative for cough.   Cardiovascular: Positive for chest pain.  Gastrointestinal: Negative for abdominal pain, nausea and vomiting.  Genitourinary: Negative for difficulty urinating.  Musculoskeletal: Positive for neck pain. Negative for back pain.  Skin: Negative for rash.  Neurological: Negative for syncope and headaches.    Physical Exam Updated Vital Signs BP 120/84   Pulse 69   Temp 98.6 F (37 C) (Oral)   Resp 16   Ht 5\' 2"  (1.575 m)   Wt 84.4 kg   SpO2 100%   BMI 34.02 kg/m   Physical  Exam Vitals and nursing note reviewed.  Constitutional:      General: She is not in acute distress.    Appearance: She is well-developed. She is not diaphoretic.  HENT:     Head: Normocephalic and atraumatic.     Comments: No oropharyngeal swelling Normal phonation No drooling  Eyes:     Conjunctiva/sclera: Conjunctivae normal.  Neck:     Comments: Swelling supraclavicular area with tenderness and extension proximally No erythema, no fluctuance, normal ROM Cardiovascular:     Rate and Rhythm: Normal rate and regular rhythm.     Heart sounds: Normal heart sounds. No murmur heard.  No friction rub. No gallop.   Pulmonary:      Effort: Pulmonary effort is normal. No respiratory distress.     Breath sounds: Normal breath sounds. No stridor. No wheezing or rales.  Abdominal:     General: There is no distension.     Palpations: Abdomen is soft.     Tenderness: There is no abdominal tenderness. There is no guarding.  Musculoskeletal:        General: No tenderness.     Cervical back: Normal range of motion.  Skin:    General: Skin is warm and dry.     Findings: No erythema or rash.  Neurological:     Mental Status: She is alert and oriented to person, place, and time.     ED Results / Procedures / Treatments   Labs (all labs ordered are listed, but only abnormal results are displayed) Labs Reviewed  CBC WITH DIFFERENTIAL/PLATELET - Abnormal; Notable for the following components:      Result Value   Hemoglobin 9.9 (*)    HCT 34.4 (*)    MCV 74.8 (*)    MCH 21.5 (*)    MCHC 28.8 (*)    RDW 18.8 (*)    All other components within normal limits  COMPREHENSIVE METABOLIC PANEL - Abnormal; Notable for the following components:   Glucose, Bld 100 (*)    All other components within normal limits  SARS CORONAVIRUS 2 BY RT PCR (HOSPITAL ORDER, PERFORMED IN [facility])  CULTURE, BLOOD (ROUTINE X 2)  CULTURE, BLOOD (ROUTINE X 2)  PREGNANCY, URINE  TROPONIN I (HIGH SENSITIVITY)  TROPONIN I (HIGH SENSITIVITY)    EKG EKG Interpretation  Date/Time:  Saturday July 10 2019 11:01:56 EDT Ventricular Rate:  69 PR Interval:    QRS Duration: 91 QT Interval:  396 QTC Calculation: 425 R Axis:   80 Text Interpretation: Sinus rhythm No significant change since last tracing Confirmed by Alvira Monday ([id number]) on 07/10/2019 11:44:07 AM   Radiology CT Soft Tissue Neck W Contrast  Addendum Date: 07/10/2019   ADDENDUM REPORT: 07/10/2019 13:13 ADDENDUM: Study discussed by telephone with Dr. Alvira Monday on 07/10/2019 at 1255 hours. My colleague Dr. Juel Burrow on her Chest CTA read raised the interesting possibility  of a left upper extremity venous thrombosis, but these neck CT images confirm patency of the left IJ, the left subclavian vein, visible left axillary vein, and other central veins. And after further discussion with Dr. Dalene Seltzer regarding patient presentation and all clinical features the etiology of the extensive soft tissue edema is unclear. There is no white count or fever. And reportedly the patient takes no medication which could have led to a reaction or toxicity. Still, the entire constellation seems most compatible with a spontaneous angioedema although the location and extent are very unusual. I am happy to discuss further with  ENT also should they have any additional ideas. Electronically Signed   By: Odessa Fleming M.D.   On: 07/10/2019 13:13   Result Date: 07/10/2019 CLINICAL DATA:  24 year old female with left neck swelling since waking today. Query Lemierre syndrome. No known injury. History of pulmonary embolus 2 years ago but not on blood thinners at this time. EXAM: CT NECK WITH CONTRAST TECHNIQUE: Multidetector CT imaging of the neck was performed using the standard protocol following the bolus administration of intravenous contrast. CONTRAST:  OMNIPAQUE IOHEXOL 350 MG/ML SOLN COMPARISON:  CTA chest today reported separately. FINDINGS: Pharynx and larynx: Laryngeal and pharyngeal soft tissue contours remain within normal limits, with no tonsillar or mucosal space hyperenhancement. However, there is conspicuous retropharyngeal fluid or edema without rim enhancement. In the neck this is most pronounced from the C2 through C4 cervical levels (sagittal image 82) although is contiguous with abundant left parapharyngeal, carotid and other deep soft tissue space edema (series 3, image 68). However, the superior parapharyngeal space on the left remains normal. The right parapharyngeal and deep soft tissue spaces are normal. Salivary glands: Sublingual, submandibular and parotid spaces are spared and  appear normal. Thyroid: Thyroid size and enhancement appears within normal limits. There are small enhancing thyroidal veins communicating with the left lower IJ, no discrete thyroid lesion. Lymph nodes: Despite the widespread deep space and trans spatial edema in the left neck (series 3, image 57) there is no associated lymphadenopathy. No enlarged or heterogeneous lymph nodes are identified. Vascular: The left IJ is patent and appears normal throughout its course. The left CCA, left ICA and external carotid also appear normal despite the soft tissue edema or fluid surrounding most of the left carotid space in the neck. Contralateral right neck and other major vascular structures appear patent and normal to the skull base. Limited intracranial: Negative. Visualized orbits: Negative. Mastoids and visualized paranasal sinuses: Clear aside from trace bubbly opacity in the right sphenoid sinus. Tympanic cavities and mastoids are clear. Skeleton: No dental abnormality identified. Normal CT appearance of the cervical spine. There are no dystrophic calcifications of the longus coli muscles. No osseous abnormality identified. Upper chest: Dedicated chest CTA is reported separately, but note that it appears the widespread deep space soft tissue edema in the left neck also continues into the mediastinum on series 3, image 92. There is mild prevascular space mass effect, but no narrowing of the trachea. Unremarkable visible esophagus. IMPRESSION: Extensive, though nonspecific soft tissue edema and/or fluid tracking throughout the deep spaces of the left lower neck and into the visible mediastinum. There is retropharyngeal space involvement also, although most pronounced in the upper cervical levels (C2 through C3). Negative for Lemierre syndrome, with patent and normal left IJ and left carotid despite edema involving the left carotid space. None of the soft tissue fluid is rim enhancing to suggest abscess. And there is no  clear source of this inflammation (e.g. no evidence of tonsillitis, no left thyroid region which might have ruptured, etc). In the lower neck the epicenter of the edema is more supraclavicular than central. Perhaps this is an unusual presentation of Angioedema, such as due to an allergic reaction. If there is neck pain and fever then a Cervical Spine MRI (without and with contrast) would be complementary to exclude an underlying discitis . Also recommend correlation with Chest CTA findings reported separately. Electronically Signed: By: Odessa Fleming M.D. On: 07/10/2019 12:21   CT Angio Chest PE W and/or Wo Contrast  Result  Date: 07/10/2019 CLINICAL DATA:  Assess for pulmonary embolus. EXAM: CT ANGIOGRAPHY CHEST WITH CONTRAST TECHNIQUE: Multidetector CT imaging of the chest was performed using the standard protocol during bolus administration of intravenous contrast. Multiplanar CT image reconstructions and MIPs were obtained to evaluate the vascular anatomy. CONTRAST:  OMNIPAQUE IOHEXOL 350 MG/ML SOLN COMPARISON:  None. FINDINGS: Cardiovascular: There is no pulmonary embolus. The aorta is normal. The heart size is normal. There is no pericardial effusion. The study is imaged in the arterial phase therefore there is limited assessment of the venous systems. Mediastinum/Nodes: There is generalized swelling edema of the left neck extend in to the mediastinum. The trachea and esophagus are normal. No definite mediastinal or hilar lymphadenopathy is identified. Lungs/Pleura: Minimal bilateral pleural effusions are identified. Mild atelectasis of lung bases are noted. Minimal patchy hazy ground-glass opacities identified in the bilateral lungs. Upper Abdomen: No acute abnormality. Musculoskeletal: No chest wall abnormality. No acute or significant osseous findings. Review of the MIP images confirms the above findings. IMPRESSION: 1. No pulmonary embolus. 2. Generalized swelling edema of the left neck extend in to the  mediastinum. This study is image in the arterial phase therefore there is limited assessment of the venous systems of the neck and mediastinum. Recommend further evaluation with ultrasound of the upper extremity and neck to out venous thrombosis. 3. Minimal bilateral pleural effusions with mild atelectasis of lung bases. 4. Minimal patchy hazy ground-glass opacities identified in the bilateral lungs. This is nonspecific but can be seen in atypical infection. These results will be called to the ordering clinician or representative by the Radiologist Assistant, and communication documented in the PACS or [employer]. Electronically Signed   By: Sherian Rein M.D.   On: 07/10/2019 12:22    Procedures .Critical Care Performed by: Alvira Monday, MD Authorized by: Alvira Monday, MD   Critical care provider statement:    Critical care time (minutes):  45   Critical care was time spent personally by me on the following activities:  Discussions with consultants, evaluation of patient's response to treatment, examination of patient, ordering and performing treatments and interventions, ordering and review of laboratory studies, ordering and review of radiographic studies, pulse oximetry, re-evaluation of patient's condition, obtaining history from patient or surrogate and review of old charts   (including critical care time)  Medications Ordered in ED Medications  dexamethasone (DECADRON) injection 10 mg (has no administration in time range)  iohexol (OMNIPAQUE) 350 MG/ML injection 100 mL (100 mLs Intravenous Contrast Given 07/10/19 1142)  dexamethasone (DECADRON) injection 20 mg (20 mg Intravenous Given 07/10/19 1341)  clindamycin (CLEOCIN) IVPB 900 mg (0 mg Intravenous Stopped 07/10/19 1504)    ED Course  I have reviewed the triage vital signs and the nursing notes.  Pertinent labs & imaging results that were available during my care of the patient were reviewed by me and considered in my  medical decision making (see chart for details).    MDM Rules/Calculators/A&P                           24 year old female with a history of asthma, pulmonary embolus no longer on anticoagulation, presents with concern for neck swelling and pain as well as chest pain and shortness of breath that began this morning.  On exam, there is no sign of drooling, oral pharyngeal swelling, no stridor, no notable dysphonia and no sign of airway compromise.   DDx includes infection of deep space  of neck, vascular thrombus, lymphadenopathy.   As she has history of thromboemolism with CP/dyspnea, ordered CT PE study in addition to CT neck with contrast for further evaluation.  Labs show no acute findings.  CT PE study and CT neck shows no evidence of pulmonary embolus or venous thrombus, however shows generalized swelling/edema of the left neck with extension into the mediastinum, with edema of the retropharyngeal space.  Do not suspect this represents abscess or infection both by history, exam and labs.  Suspect idiopathic edema, angioedema of unclear etiology in unusual location.  Discussed with Dr. Suszanne Conners of ENT, who recommends Decadron and empiric clindamycin and will see her in the hospital. Dr. Morton Peters of CT surgery also consulted and will see [facility] at [facility].  Do not suspect she will require surgery.  Dr. Suszanne Conners recommends 20mg  IV decadron, 900mg  IV clindamycin empirically now and 600QID clinda and decadron 10mg  BID.  Will continue to monitor, telemetry appropriate at this time, speaking comfortably, no sign of airway compromise.   Final Clinical Impression(s) / ED Diagnoses Final diagnoses:  Edema, unspecified type  Neck swelling  Angioedema, initial encounter    Rx / DC Orders ED Discharge Orders    None       , MD 07/10/19 1544

## 2019-07-11 DIAGNOSIS — R221 Localized swelling, mass and lump, neck: Secondary | ICD-10-CM | POA: Diagnosis not present

## 2019-07-11 DIAGNOSIS — T783XXA Angioneurotic edema, initial encounter: Principal | ICD-10-CM

## 2019-07-11 LAB — HIV ANTIBODY (ROUTINE TESTING W REFLEX): HIV Screen 4th Generation wRfx: NONREACTIVE

## 2019-07-11 LAB — BASIC METABOLIC PANEL
Anion gap: 8 (ref 5–15)
BUN: 8 mg/dL (ref 6–20)
CO2: 20 mmol/L — ABNORMAL LOW (ref 22–32)
Calcium: 9.1 mg/dL (ref 8.9–10.3)
Chloride: 107 mmol/L (ref 98–111)
Creatinine, Ser: 0.58 mg/dL (ref 0.44–1.00)
GFR calc Af Amer: >60 mL/min (ref >60)
GFR calc non Af Amer: >60 mL/min (ref >60)
Glucose, Bld: 118 mg/dL — ABNORMAL HIGH (ref 70–99)
Potassium: 4 mmol/L (ref 3.5–5.1)
Sodium: 135 mmol/L (ref 135–145)

## 2019-07-11 LAB — PROTIME-INR
INR: 1.2 (ref 0.8–1.2)
Prothrombin Time: 14.3 seconds (ref 11.4–15.2)

## 2019-07-11 LAB — CBC
HCT: 33.5 % — ABNORMAL LOW (ref 36.0–46.0)
Hemoglobin: 10 g/dL — ABNORMAL LOW (ref 12.0–15.0)
MCH: 21.7 pg — ABNORMAL LOW (ref 26.0–34.0)
MCHC: 29.9 g/dL — ABNORMAL LOW (ref 30.0–36.0)
MCV: 72.7 fL — ABNORMAL LOW (ref 80.0–100.0)
Platelets: 409 10*3/uL — ABNORMAL HIGH (ref 150–400)
RBC: 4.61 MIL/uL (ref 3.87–5.11)
RDW: 18.4 % — ABNORMAL HIGH (ref 11.5–15.5)
WBC: 6 10*3/uL (ref 4.0–10.5)
nRBC: 0 % (ref 0.0–0.2)

## 2019-07-11 MED ORDER — ALBUTEROL SULFATE HFA 108 (90 BASE) MCG/ACT IN AERS
2.0000 | INHALATION_SPRAY | Freq: Four times a day (QID) | RESPIRATORY_TRACT | 1 refills | Status: AC | PRN
Start: 2019-07-11 — End: ?

## 2019-07-11 MED ORDER — FERROUS SULFATE 325 (65 FE) MG PO TABS: 325.0000 mg | ORAL_TABLET | Freq: Every day | ORAL | 1 refills | Status: AC

## 2019-07-11 MED ORDER — CLINDAMYCIN HCL 300 MG PO CAPS
300.0000 mg | ORAL_CAPSULE | Freq: Three times a day (TID) | ORAL | 0 refills | Status: AC
Start: 2019-07-11 — End: 2019-07-16

## 2019-07-11 MED ORDER — PREDNISONE 10 MG PO TABS: 10.0000 mg | ORAL_TABLET | Freq: Every day | ORAL | 0 refills | Status: AC

## 2019-07-11 NOTE — Discharge Summary (Addendum)
Physician Discharge Summary  Judith Robinson ZOX:[id number] DOB: 1995/03/28 DOA: 07/10/2019  PCP: Patient, No Pcp Per  Admit date: 07/10/2019 Discharge date: 07/11/2019  Admitted From: Home Disposition:  Home  Discharge Condition:Stable CODE STATUS:FULL Diet recommendation: Heart Healthy  Brief/Interim Summary: HPI: Judith Robinson is a 24 y.o. female with medical history significant of asthma and PE (no longer on [facility]) who presented for left sided neck swelling.  She notes that she woke with this this morning.  Also with discomfort which is achy and worse when she moves her neck.  She has no tooth drainage or pain, no issues on the interior of her mouth.  The area of swelling does not move into the head or around the ear.  She does not remember a bit and has no puncture marks.  She has not been stung.  She is not aware of any food allergies and did not eat anything out of the ordinary.  No new soaps or shampoos, no rash.  [place] and might have been exposed to something in the water.  She did note that it hurt to take a deep breath in, but no cough or other pulmonary issues.   ED Course: In the ED, she was noted microcytic anemia with an MCV of 74.  Otherwise, blood work was normal.  Blood cultures were drawn.  CT of the chest and neck showed no PE.  She had generalized swelling of the left neck which extended into the mediastinum.  She had minimal pleural effusions and patchy, hazy ground glass opacities in both lungs.  ENT (Dr. Suszanne Conners) and CTS (Dr. Morton Peters) were consulted and will see her now that she is at [facility].   Hospital course:  Her hospital course remained stable.  She presented with swelling of the neck with unknown etiology.  She was started on clindamycin and Decadron with significant improvement today.  She does not complain of any pain, swelling has significantly improved, no odynophagia or dysphagia.  She has remained hemodynamically stable.  She is stable   for discharge to home with oral antibiotics and prednisone.  CTCS and  ENT cleared for discharge.  She will follow-up with ENT as an outpatient.  Following problems were addressed during her hospitalization:  Neck swelling - Management as above -Significant improvement today  Microcytic Anemia -Likely associated with menorrhagia -Start on iron supplementation  Asthma - PRN albuterol inhaler   Discharge Diagnoses:  Active Problems:   Angioedema   Neck swelling    Discharge Instructions  Discharge Instructions    Diet - low sodium heart healthy   Complete by: As directed    Discharge instructions   Complete by: As directed    1)Please take prescribed medications as instructed. 2)Follow up with ENT as an outpatient in a week. Name and number of the provider has been attached.   Increase activity slowly   Complete by: As directed      Allergies as of 07/11/2019   No Known Allergies     Medication List    STOP taking these medications   cephALEXin 500 MG capsule Commonly known as: KEFLEX   doxycycline 100 MG capsule Commonly known as: VIBRAMYCIN   ibuprofen 600 MG tablet Commonly known as: ADVIL   MonoNessa 0.25-35 MG-MCG tablet Generic drug: norgestimate-ethinyl estradiol   traMADol 50 MG tablet Commonly known as: ULTRAM     TAKE these medications   albuterol 108 (90 Base) MCG/ACT inhaler Commonly  known as: VENTOLIN HFA Inhale 2 puffs into the lungs every 6 (six) hours as needed for wheezing or shortness of breath.   clindamycin 300 MG capsule Commonly known as: CLEOCIN Take 1 capsule (300 mg total) by mouth 3 (three) times daily for 5 days.   ferrous sulfate 325 (65 FE) MG tablet Take 1 tablet (325 mg total) by mouth daily.   predniSONE 10 MG tablet Commonly known as: DELTASONE Take 1 tablet (10 mg total) by mouth daily. Take 60 mg tomorrow followed by 50 mg for 1 day then 40 mg for 1 day then 30 mg for 1 day then 20 mg for 1 day then 10 mg for 1   day then stop       Follow-up Information    Newman Pies, MD. Schedule an appointment as soon as possible for a visit in 1 week(s).   Specialty: Otolaryngology Contact information: [address] [place] [address] [place] [address] [phone]              No Known Allergies  Consultations:  CTCS,ENT   Procedures/Studies: CT Soft Tissue Neck W Contrast  Addendum Date: 07/10/2019   ADDENDUM REPORT: 07/10/2019 13:13 ADDENDUM: Study discussed by telephone with Dr. Alvira Monday on 07/10/2019 at 1255 hours. My colleague Dr. Juel Burrow on her Chest CTA read raised the interesting possibility of a left upper extremity venous thrombosis, but these neck CT images confirm patency of the left IJ, the left subclavian vein, visible left axillary vein, and other central veins. And after further discussion with Dr. Dalene Seltzer regarding patient presentation and all clinical features the etiology of the extensive soft tissue edema is unclear. There is no white count or fever. And reportedly the patient takes no medication which could have led to a reaction or toxicity. Still, the entire constellation seems most compatible with a spontaneous angioedema although the location and extent are very unusual. I am happy to discuss further with ENT also should they have any additional ideas. Electronically Signed   By: Odessa Fleming M.D.   On: 07/10/2019 13:13   Result Date: 07/10/2019 CLINICAL DATA:  24 year old female with left neck swelling since waking today. Query Lemierre syndrome. No known injury. History of pulmonary embolus 2 years ago but not on blood thinners at this time. EXAM: CT NECK WITH CONTRAST TECHNIQUE: Multidetector CT imaging of the neck was performed using the standard protocol following the bolus administration of intravenous contrast. CONTRAST:  OMNIPAQUE IOHEXOL 350 MG/ML SOLN COMPARISON:  CTA chest today reported separately. FINDINGS: Pharynx and larynx: Laryngeal and pharyngeal soft tissue contours  remain within normal limits, with no tonsillar or mucosal space hyperenhancement. However, there is conspicuous retropharyngeal fluid or edema without rim enhancement. In the neck this is most pronounced from the C2 through C4 cervical levels (sagittal image 82) although is contiguous with abundant left parapharyngeal, carotid and other deep soft tissue space edema (series 3, image 68). However, the superior parapharyngeal space on the left remains normal. The right parapharyngeal and deep soft tissue spaces are normal. Salivary glands: Sublingual, submandibular and parotid spaces are spared and appear normal. Thyroid: Thyroid size and enhancement appears within normal limits. There are small enhancing thyroidal veins communicating with the left lower IJ, no discrete thyroid lesion. Lymph nodes: Despite the widespread deep space and trans spatial edema in the left neck (series 3, image 57) there is no associated lymphadenopathy. No enlarged or heterogeneous lymph nodes are identified. Vascular: The left IJ is patent and  appears normal throughout its course. The left CCA, left ICA and external carotid also appear normal despite the soft tissue edema or fluid surrounding most of the left carotid space in the neck. Contralateral right neck and other major vascular structures appear patent and normal to the skull base. Limited intracranial: Negative. Visualized orbits: Negative. Mastoids and visualized paranasal sinuses: Clear aside from trace bubbly opacity in the right sphenoid sinus. Tympanic cavities and mastoids are clear. Skeleton: No dental abnormality identified. Normal CT appearance of the cervical spine. There are no dystrophic calcifications of the longus coli muscles. No osseous abnormality identified. Upper chest: Dedicated chest CTA is reported separately, but note that it appears the widespread deep space soft tissue edema in the left neck also continues into the mediastinum on series 3, image 92. There  is mild prevascular space mass effect, but no narrowing of the trachea. Unremarkable visible esophagus. IMPRESSION: Extensive, though nonspecific soft tissue edema and/or fluid tracking throughout the deep spaces of the left lower neck and into the visible mediastinum. There is retropharyngeal space involvement also, although most pronounced in the upper cervical levels (C2 through C3). Negative for Lemierre syndrome, with patent and normal left IJ and left carotid despite edema involving the left carotid space. None of the soft tissue fluid is rim enhancing to suggest abscess. And there is no clear source of this inflammation (e.g. no evidence of tonsillitis, no left thyroid region which might have ruptured, etc). In the lower neck the epicenter of the edema is more supraclavicular than central. Perhaps this is an unusual presentation of Angioedema, such as due to an allergic reaction. If there is neck pain and fever then a Cervical Spine MRI (without and with contrast) would be complementary to exclude an underlying discitis . Also recommend correlation with Chest CTA findings reported separately. Electronically Signed: By: Odessa Fleming M.D. On: 07/10/2019 12:21   CT Angio Chest PE W and/or Wo Contrast  Result Date: 07/10/2019 CLINICAL DATA:  Assess for pulmonary embolus. EXAM: CT ANGIOGRAPHY CHEST WITH CONTRAST TECHNIQUE: Multidetector CT imaging of the chest was performed using the standard protocol during bolus administration of intravenous contrast. Multiplanar CT image reconstructions and MIPs were obtained to evaluate the vascular anatomy. CONTRAST:  OMNIPAQUE IOHEXOL 350 MG/ML SOLN COMPARISON:  None. FINDINGS: Cardiovascular: There is no pulmonary embolus. The aorta is normal. The heart size is normal. There is no pericardial effusion. The study is imaged in the arterial phase therefore there is limited assessment of the venous systems. Mediastinum/Nodes: There is generalized swelling edema of the left  neck extend in to the mediastinum. The trachea and esophagus are normal. No definite mediastinal or hilar lymphadenopathy is identified. Lungs/Pleura: Minimal bilateral pleural effusions are identified. Mild atelectasis of lung bases are noted. Minimal patchy hazy ground-glass opacities identified in the bilateral lungs. Upper Abdomen: No acute abnormality. Musculoskeletal: No chest wall abnormality. No acute or significant osseous findings. Review of the MIP images confirms the above findings. IMPRESSION: 1. No pulmonary embolus. 2. Generalized swelling edema of the left neck extend in to the mediastinum. This study is image in the arterial phase therefore there is limited assessment of the venous systems of the neck and mediastinum. Recommend further evaluation with ultrasound of the upper extremity and neck to out venous thrombosis. 3. Minimal bilateral pleural effusions with mild atelectasis of lung bases. 4. Minimal patchy hazy ground-glass opacities identified in the bilateral lungs. This is nonspecific but can be seen in atypical infection. These results will  be called to the ordering clinician or representative by the Radiologist Assistant, and communication documented in the PACS or [employer]. Electronically Signed   By: Sherian Rein M.D.   On: 07/10/2019 12:22      Subjective: Patient seen and examined at the bedside this morning.  Stable for discharge home today.  Discharge Exam: Vitals:   07/10/19 1720 07/10/19 2212  BP: 127/79 115/73  Pulse: 89 80  Resp: 18 16  Temp: 98.1 F (36.7 C) 98.1 F (36.7 C)  SpO2: 99% 98%   Vitals:   07/10/19 1330 07/10/19 1608 07/10/19 1720 07/10/19 2212  BP: 120/84 112/73 127/79 115/73  Pulse: 69 95 89 80  Resp: 16 20 18 16   Temp:  98.5 F (36.9 C) 98.1 F (36.7 C) 98.1 F (36.7 C)  TempSrc:  Oral  Oral  SpO2: 100% 100% 99% 98%  Weight:      Height:        General: Pt is alert, awake, not in acute distress Cardiovascular: RRR, S1/S2  +, no rubs, no gallops Respiratory: CTA bilaterally, no wheezing, no rhonchi Abdominal: Soft, NT, ND, bowel sounds + Extremities: no edema, no cyanosis    The results of significant diagnostics from this hospitalization (including imaging, microbiology, ancillary and laboratory) are listed below for reference.     Microbiology: Recent Results (from the past 240 hour(s))  Blood culture (routine x 2)     Status: None (Preliminary result)   Collection Time: 07/10/19  1:30 PM   Specimen: BLOOD  Result Value Ref Range Status   Specimen Description   Final    BLOOD RIGHT ANTECUBITAL Performed at [facility], [address], [place], [place]    Special Requests   Final    BOTTLES DRAWN AEROBIC ONLY Blood Culture adequate volume Performed at [facility], [address] [place], [place]    Culture   Final    NO GROWTH < 24 HOURS Performed at [facility], [address], [place], [place]    Report Status PENDING  Incomplete  Blood culture (routine x 2)     Status: None (Preliminary result)   Collection Time: 07/10/19  1:35 PM   Specimen: BLOOD LEFT FOREARM  Result Value Ref Range Status   Specimen Description   Final    BLOOD LEFT FOREARM Performed at [facility], [address] [place], [place]    Special Requests   Final    BOTTLES DRAWN AEROBIC AND ANAEROBIC Blood Culture results may not be optimal due to an inadequate volume of blood received in culture bottles Performed at [facility], [address] [place], [place]    Culture   Final    NO GROWTH < 24 HOURS Performed at [facility], [address] [place], [place]    Report Status PENDING  Incomplete  SARS Coronavirus 2 by RT PCR (hospital order, performed in [facility] hospital lab) Nasopharyngeal Nasopharyngeal Swab     Status: None   Collection Time: 07/10/19  1:48 PM   Specimen: Nasopharyngeal Swab   Result Value Ref Range Status   SARS Coronavirus 2 NEGATIVE NEGATIVE Final    Comment: (NOTE) SARS-CoV-2 target nucleic acids are NOT DETECTED.  The SARS-CoV-2 RNA is generally detectable in upper and lower respiratory specimens during the acute phase of infection. The lowest concentration of SARS-CoV-2 viral copies this assay  can detect is 250 copies / mL. A negative result does not preclude SARS-CoV-2 infection and should not be used as the sole basis for treatment or other patient management decisions.  A negative result may occur with improper specimen collection / handling, submission of specimen other than nasopharyngeal swab, presence of viral mutation(s) within the areas targeted by this assay, and inadequate number of viral copies (<250 copies / mL). A negative result must be combined with clinical observations, patient history, and epidemiological information.  Fact Sheet for Patients:   BoilerBrush.com.cy  Fact Sheet for Healthcare Providers: https://pope.com/  This test is not yet approved or  cleared by the [place] FDA and has been authorized for detection and/or diagnosis of SARS-CoV-2 by FDA under an Emergency Use Authorization (EUA).  This EUA will remain in effect (meaning this test can be used) for the duration of the COVID-19 declaration under Section 564(b)(1) of the Act, 21 U.S.C. section 360bbb-3(b)(1), unless the authorization is terminated or revoked sooner.  Performed at [facility], [address] [place], [place] [address]      Labs: BNP (last 3 results) No results for input(s): BNP in the last 8760 hours. Basic Metabolic Panel: Recent Labs  Lab 07/10/19 1056 07/11/19 0319  NA 139 135  K 3.5 4.0  CL 105 107  CO2 26 20*  GLUCOSE 100* 118*  BUN 9 8  CREATININE 0.56 0.58  CALCIUM 9.0 9.1   Liver Function Tests: Recent Labs  Lab 07/10/19 1056  AST 17  ALT 13  ALKPHOS  53  BILITOT 0.5  PROT 7.6  ALBUMIN 4.0   No results for input(s): LIPASE, AMYLASE in the last 168 hours. No results for input(s): AMMONIA in the last 168 hours. CBC: Recent Labs  Lab 07/10/19 1056 07/11/19 0319  WBC 4.6 6.0  NEUTROABS 1.8  --   HGB 9.9* 10.0*  HCT 34.4* 33.5*  MCV 74.8* 72.7*  PLT 387 409*   Cardiac Enzymes: No results for input(s): CKTOTAL, CKMB, CKMBINDEX, TROPONINI in the last 168 hours. BNP: Invalid input(s): POCBNP CBG: No results for input(s): GLUCAP in the last 168 hours. D-Dimer No results for input(s): DDIMER in the last 72 hours. Hgb A1c No results for input(s): HGBA1C in the last 72 hours. Lipid Profile No results for input(s): CHOL, HDL, LDLCALC, TRIG, CHOLHDL, LDLDIRECT in the last 72 hours. Thyroid function studies No results for input(s): TSH, T4TOTAL, T3FREE, THYROIDAB in the last 72 hours.  Invalid input(s): FREET3 Anemia work up No results for input(s): VITAMINB12, FOLATE, FERRITIN, TIBC, IRON, RETICCTPCT in the last 72 hours. Urinalysis    Component Value Date/Time   COLORURINE YELLOW 09/15/2016 0000   APPEARANCEUR HAZY (A) 09/15/2016 0000   LABSPEC 1.010 09/15/2016 0000   PHURINE 7.0 09/15/2016 0000   GLUCOSEU NEGATIVE 09/15/2016 0000   HGBUR TRACE (A) 09/15/2016 0000   BILIRUBINUR NEGATIVE 09/15/2016 0000   KETONESUR NEGATIVE 09/15/2016 0000   PROTEINUR NEGATIVE 09/15/2016 0000   NITRITE NEGATIVE 09/15/2016 0000   LEUKOCYTESUR MODERATE (A) 09/15/2016 0000   Sepsis Labs Invalid input(s): PROCALCITONIN,  WBC,  LACTICIDVEN Microbiology Recent Results (from the past 240 hour(s))  Blood culture (routine x 2)     Status: None (Preliminary result)   Collection Time: 07/10/19  1:30 PM   Specimen: BLOOD  Result Value Ref Range Status   Specimen Description   Final    BLOOD RIGHT ANTECUBITAL Performed at [facility], [address] [place], [place] [address]  Special Requests   Final    BOTTLES DRAWN AEROBIC ONLY  Blood Culture adequate volume Performed at [facility] [place], [address] [place], [place] [address]    Culture   Final    NO GROWTH < 24 HOURS Performed at [facility], [address] [place], [place] [address]    Report Status PENDING  Incomplete  Blood culture (routine x 2)     Status: None (Preliminary result)   Collection Time: 07/10/19  1:35 PM   Specimen: BLOOD LEFT FOREARM  Result Value Ref Range Status   Specimen Description   Final    BLOOD LEFT FOREARM Performed at [facility] [place], [address] [place], [place] [address]    Special Requests   Final    BOTTLES DRAWN AEROBIC AND ANAEROBIC Blood Culture results may not be optimal due to an inadequate volume of blood received in culture bottles Performed at [facility] [place], [address] [place], [place] [address]    Culture   Final    NO GROWTH < 24 HOURS Performed at [facility], [address] [place], [place] [address]    Report Status PENDING  Incomplete  SARS Coronavirus 2 by RT PCR (hospital order, performed in [facility] hospital lab) Nasopharyngeal Nasopharyngeal Swab     Status: None   Collection Time: 07/10/19  1:48 PM   Specimen: Nasopharyngeal Swab  Result Value Ref Range Status   SARS Coronavirus 2 NEGATIVE NEGATIVE Final    Comment: (NOTE) SARS-CoV-2 target nucleic acids are NOT DETECTED.  The SARS-CoV-2 RNA is generally detectable in upper and lower respiratory specimens during the acute phase of infection. The lowest concentration of SARS-CoV-2 viral copies this assay can detect is 250 copies / mL. A negative result does not preclude SARS-CoV-2 infection and should not be used as the sole basis for treatment or other patient management decisions.  A negative result may occur with improper specimen collection / handling, submission of specimen other than nasopharyngeal swab, presence of viral mutation(s) within the areas targeted by this assay, and  inadequate number of viral copies (<250 copies / mL). A negative result must be combined with clinical observations, patient history, and epidemiological information.  Fact Sheet for Patients:   BoilerBrush.com.cyhttps://www.fda.gov/media/136312/download  Fact Sheet for Healthcare Providers: https://pope.com/https://www.fda.gov/media/136313/download  This test is not yet approved or  cleared by the [place] FDA and has been authorized for detection and/or diagnosis of SARS-CoV-2 by FDA under an Emergency Use Authorization (EUA).  This EUA will remain in effect (meaning this test can be used) for the duration of the COVID-19 declaration under Section 564(b)(1) of the Act, 21 U.S.C. section 360bbb-3(b)(1), unless the authorization is terminated or revoked sooner.  Performed at [facility] [place], [address] [place], [place] [address]     Please note: You were cared for by a hospitalist during your hospital stay. Once you are discharged, your primary care physician will handle any further medical issues. Please note that NO REFILLS for any discharge medications will be authorized once you are discharged, as it is imperative that you return to your primary care physician (or establish a relationship with a primary care physician if you do not have one) for your post hospital discharge needs so that they can reassess your need for medications and monitor your lab values.    Time coordinating discharge: 40 minutes  SIGNED:   Burnadette PopAmrit Sophee Mckimmy, MD  [facility] 07/11/2019, 1:11 PM Pager [phone]  If 7PM-7AM, please contact night-coverage [email or web address] Password [email or web address]

## 2019-07-11 NOTE — Consult Note (Signed)
[address]       [place] [address]             [phone]        Najla [facility] Medical Record #[id number] Date of Birth: September 12, 1995  Referring: No ref. provider found Primary Care: Patient, No Pcp Per Primary Cardiologist:No primary care provider on file.  Chief Complaint:    Chief Complaint  Patient presents with  . Neck swelling    History of Present Illness:     Patient examined and CT scan of chest images personally reviewed.                                                                                            24 year old healthy female admitted to the ED for rather sudden onset of swallowing at the base of her left neck extending towards the supraclavicular fossa.  CT scan was reported as showing some edema in the soft tissues extending into the superior mediastinum.  I reviewed the images and not see any significant abnormalities in the superior mediastinum other than an abundance of mediastinal fat.  No evidence of vascular DVT of the neck or arm.  The patient denies any recent history of fever, night sweats, weight loss, sore throat, insect bites to the left arm or shoulder, or trauma.   Patient has been started on Decadron and oral doxycycline and states that she feels the swelling is improved.  On my exam I detect no evidence of abnormality today.                                                                                                                                                             Current Activity/ Functional Status: Not currently employed activity level   Zubrod Score: At the time of surgery this patient's most appropriate activity status/level should be described as: []     0    Normal activity, no symptoms []     1    Restricted in physical strenuous activity but ambulatory, able to do out light work []     2    Ambulatory and capable of self care, unable to do work activities, up and about                  more than 50%  Of the time                            []   3    Only limited self care, in bed greater than 50% of waking hours []     4    Completely disabled, no self care, confined to bed or chair []     5    Moribund  Past Medical History:  Diagnosis Date  . Asthma   . History of pulmonary embolism     Past Surgical History:  Procedure Laterality Date  . ADENOIDECTOMY    . TONSILLECTOMY      Social History   Tobacco Use  Smoking Status Current Every Day Smoker  . Types: E-cigarettes  Smokeless Tobacco Never Used    Social History   Substance and Sexual Activity  Alcohol Use No     No Known Allergies  Current Facility-Administered Medications  Medication Dose Route Frequency Provider Last Rate Last Admin  . clindamycin (CLEOCIN) IVPB 600 mg  600 mg Intravenous Q6H Alvira MondaySchlossman, Erin, MD 100 mL/hr at 07/11/19 0805 600 mg at 07/11/19 0805  . dexamethasone (DECADRON) injection 10 mg  10 mg Intravenous Q12H Charlynne PanderYao, David Hsienta, MD   10 mg at 07/11/19 0804  . ibuprofen (ADVIL) tablet 400 mg  400 mg Oral Q6H PRN Inez CatalinaMullen, Emily B, MD      . ondansetron [facility](ZOFRAN) tablet 4 mg  4 mg Oral Q6H PRN Inez CatalinaMullen, Emily B, MD       Or  . ondansetron [facility](ZOFRAN) injection 4 mg  4 mg Intravenous Q6H PRN Debe CoderMullen, Emily B, MD      . oxyCODONE (Oxy IR/ROXICODONE) immediate release tablet 5 mg  5 mg Oral Q4H PRN Inez CatalinaMullen, Emily B, MD   5 mg at 07/11/19 0813  . sodium chloride flush (NS) 0.9 % injection 3 mL  3 mL Intravenous Q12H Inez CatalinaMullen, Emily B, MD   3 mL at 07/11/19 0806    Medications Prior to Admission  Medication Sig Dispense Refill Last Dose  . cephALEXin (KEFLEX) 500 MG capsule Take 1 capsule (500 mg total) by mouth 2 (two) times daily. (Patient not taking: Reported on 07/10/2019) 14 capsule 0 Not Taking at Unknown time  . doxycycline (VIBRAMYCIN) 100 MG capsule Take 1 capsule (100 mg total) by mouth 2 (two) times daily. (Patient not taking: Reported on 07/10/2019) 20 capsule 0 Not Taking at Unknown  time  . ibuprofen (ADVIL,MOTRIN) 600 MG tablet Take 1 tablet (600 mg total) by mouth every 6 (six) hours as needed for moderate pain. (Patient not taking: Reported on 07/10/2019) 30 tablet 0 Not Taking at Unknown time  . MONONESSA 0.25-35 MG-MCG tablet Take 1 tablet by mouth daily. (Patient not taking: Reported on 07/10/2019)  4 Not Taking at Unknown time  . traMADol (ULTRAM) 50 MG tablet Take 1 tablet (50 mg total) by mouth every 6 (six) hours as needed. (Patient not taking: Reported on 07/10/2019) 15 tablet 0 Not Taking at Unknown time    Family History  Problem Relation Age of Onset  . Multiple sclerosis Mother   . Thyroid disease Father        unknown     Review of Systems:   ROS      Cardiac Review of Systems: Y or  [    ]= no  Chest Pain [    ]  Resting SOB [   ] Exertional SOB  [  ]  Orthopnea [  ]   Pedal Edema [   ]    Palpitations [  ] Syncope  [  ]   Presyncope [   ]  General Review of Systems: [Y] = yes [  ]=no Constitional: recent weight change [  ]; anorexia [  ]; fatigue [  ]; nausea [  ]; night sweats [  ]; fever [  ]; or chills [  ]                                                               Dental: Last Dentist visit: Greater than 1 year  Eye : blurred vision [  ]; diplopia [   ]; vision changes [  ];  Amaurosis fugax[  ]; Resp: cough [  ];  wheezing[  ];  hemoptysis[  ]; shortness of breath[  ]; paroxysmal nocturnal dyspnea[  ]; dyspnea on exertion[  ]; or orthopnea[  ];  GI:  gallstones[  ], vomiting[  ];  dysphagia[  ]; melena[  ];  hematochezia [  ]; heartburn[  ];   Hx of  Colonoscopy[  ]; GU: kidney stones [  ]; hematuria[  ];   dysuria [  ];  nocturia[  ];  history of     obstruction [  ]; urinary frequency [  ]             Skin: rash, swelling[  ];, hair loss[  ];  peripheral edema[  ];  or itching[  ]; Musculosketetal: myalgias[  ];  joint swelling[  ];  joint erythema[  ];  joint pain[  ];  back pain[  ];  Heme/Lymph: bruising[  ];  bleeding[  ];  anemia[  ];    Neuro: TIA[  ];  headaches[  ];  stroke[  ];  vertigo[  ];  seizures[  ];   paresthesias[  ];  difficulty walking[  ];  Psych:depression[  ]; anxiety[  ];  Endocrine: diabetes[  ];  thyroid dysfunction[  ];                Physical Exam: BP 115/73 (BP Location: Left Arm)   Pulse 80   Temp 98.1 F (36.7 C) (Oral)   Resp 16   Ht 5\' 2"  (1.575 m)   Wt 84.4 kg   SpO2 98%   BMI 34.02 kg/m         Exam    General- alert and comfortable    Neck- no JVD, no cervical adenopathy palpable, no carotid bruit   Lungs- clear without rales, wheezes   Cor- regular rate and rhythm, no murmur , gallop   Abdomen- soft, non-tender   Extremities - warm, non-tender, minimal edema   Neuro- oriented, appropriate, no focal weakness    Diagnostic Studies & Laboratory data:     Recent Radiology Findings:   CT Soft Tissue Neck W Contrast  Addendum Date: 07/10/2019   ADDENDUM REPORT: 07/10/2019 13:13 ADDENDUM: Study discussed by telephone with Dr. 09/10/2019 on 07/10/2019 at 1255 hours. My colleague Dr. 09/10/2019 on her Chest CTA read raised the interesting possibility of a left upper extremity venous thrombosis, but these neck CT images confirm patency of the left IJ, the left subclavian vein, visible left axillary vein, and other central veins. And after further discussion with Dr. Juel Burrow regarding patient presentation and all clinical features the etiology of the extensive soft tissue edema is unclear. There is no white count  or fever. And reportedly the patient takes no medication which could have led to a reaction or toxicity. Still, the entire constellation seems most compatible with a spontaneous angioedema although the location and extent are very unusual. I am happy to discuss further with ENT also should they have any additional ideas. Electronically Signed   By: Odessa Fleming M.D.   On: 07/10/2019 13:13   Result Date: 07/10/2019 CLINICAL DATA:  24 year old female with left neck swelling since waking  today. Query Lemierre syndrome. No known injury. History of pulmonary embolus 2 years ago but not on blood thinners at this time. EXAM: CT NECK WITH CONTRAST TECHNIQUE: Multidetector CT imaging of the neck was performed using the standard protocol following the bolus administration of intravenous contrast. CONTRAST:  OMNIPAQUE IOHEXOL 350 MG/ML SOLN COMPARISON:  CTA chest today reported separately. FINDINGS: Pharynx and larynx: Laryngeal and pharyngeal soft tissue contours remain within normal limits, with no tonsillar or mucosal space hyperenhancement. However, there is conspicuous retropharyngeal fluid or edema without rim enhancement. In the neck this is most pronounced from the C2 through C4 cervical levels (sagittal image 82) although is contiguous with abundant left parapharyngeal, carotid and other deep soft tissue space edema (series 3, image 68). However, the superior parapharyngeal space on the left remains normal. The right parapharyngeal and deep soft tissue spaces are normal. Salivary glands: Sublingual, submandibular and parotid spaces are spared and appear normal. Thyroid: Thyroid size and enhancement appears within normal limits. There are small enhancing thyroidal veins communicating with the left lower IJ, no discrete thyroid lesion. Lymph nodes: Despite the widespread deep space and trans spatial edema in the left neck (series 3, image 57) there is no associated lymphadenopathy. No enlarged or heterogeneous lymph nodes are identified. Vascular: The left IJ is patent and appears normal throughout its course. The left CCA, left ICA and external carotid also appear normal despite the soft tissue edema or fluid surrounding most of the left carotid space in the neck. Contralateral right neck and other major vascular structures appear patent and normal to the skull base. Limited intracranial: Negative. Visualized orbits: Negative. Mastoids and visualized paranasal sinuses: Clear aside from trace  bubbly opacity in the right sphenoid sinus. Tympanic cavities and mastoids are clear. Skeleton: No dental abnormality identified. Normal CT appearance of the cervical spine. There are no dystrophic calcifications of the longus coli muscles. No osseous abnormality identified. Upper chest: Dedicated chest CTA is reported separately, but note that it appears the widespread deep space soft tissue edema in the left neck also continues into the mediastinum on series 3, image 92. There is mild prevascular space mass effect, but no narrowing of the trachea. Unremarkable visible esophagus. IMPRESSION: Extensive, though nonspecific soft tissue edema and/or fluid tracking throughout the deep spaces of the left lower neck and into the visible mediastinum. There is retropharyngeal space involvement also, although most pronounced in the upper cervical levels (C2 through C3). Negative for Lemierre syndrome, with patent and normal left IJ and left carotid despite edema involving the left carotid space. None of the soft tissue fluid is rim enhancing to suggest abscess. And there is no clear source of this inflammation (e.g. no evidence of tonsillitis, no left thyroid region which might have ruptured, etc). In the lower neck the epicenter of the edema is more supraclavicular than central. Perhaps this is an unusual presentation of Angioedema, such as due to an allergic reaction. If there is neck pain and fever then a Cervical Spine MRI (  without and with contrast) would be complementary to exclude an underlying discitis . Also recommend correlation with Chest CTA findings reported separately. Electronically Signed: By: Odessa Fleming M.D. On: 07/10/2019 12:21   CT Angio Chest PE W and/or Wo Contrast  Result Date: 07/10/2019 CLINICAL DATA:  Assess for pulmonary embolus. EXAM: CT ANGIOGRAPHY CHEST WITH CONTRAST TECHNIQUE: Multidetector CT imaging of the chest was performed using the standard protocol during bolus administration of  intravenous contrast. Multiplanar CT image reconstructions and MIPs were obtained to evaluate the vascular anatomy. CONTRAST:  OMNIPAQUE IOHEXOL 350 MG/ML SOLN COMPARISON:  None. FINDINGS: Cardiovascular: There is no pulmonary embolus. The aorta is normal. The heart size is normal. There is no pericardial effusion. The study is imaged in the arterial phase therefore there is limited assessment of the venous systems. Mediastinum/Nodes: There is generalized swelling edema of the left neck extend in to the mediastinum. The trachea and esophagus are normal. No definite mediastinal or hilar lymphadenopathy is identified. Lungs/Pleura: Minimal bilateral pleural effusions are identified. Mild atelectasis of lung bases are noted. Minimal patchy hazy ground-glass opacities identified in the bilateral lungs. Upper Abdomen: No acute abnormality. Musculoskeletal: No chest wall abnormality. No acute or significant osseous findings. Review of the MIP images confirms the above findings. IMPRESSION: 1. No pulmonary embolus. 2. Generalized swelling edema of the left neck extend in to the mediastinum. This study is image in the arterial phase therefore there is limited assessment of the venous systems of the neck and mediastinum. Recommend further evaluation with ultrasound of the upper extremity and neck to out venous thrombosis. 3. Minimal bilateral pleural effusions with mild atelectasis of lung bases. 4. Minimal patchy hazy ground-glass opacities identified in the bilateral lungs. This is nonspecific but can be seen in atypical infection. These results will be called to the ordering clinician or representative by the Radiologist Assistant, and communication documented in the PACS or [employer]. Electronically Signed   By: Sherian Rein M.D.   On: 07/10/2019 12:22     I have independently reviewed the above radiologic studies and discussed with the patient   Recent Lab Findings: Lab Results  Component Value Date    WBC 6.0 07/11/2019   HGB 10.0 (L) 07/11/2019   HCT 33.5 (L) 07/11/2019   PLT 409 (H) 07/11/2019   GLUCOSE 118 (H) 07/11/2019   ALT 13 07/10/2019   AST 17 07/10/2019   NA 135 07/11/2019   K 4.0 07/11/2019   CL 107 07/11/2019   CREATININE 0.58 07/11/2019   BUN 8 07/11/2019   CO2 20 (L) 07/11/2019   INR 1.2 07/11/2019      Assessment / Plan:   No evidence of infection or neoplastic process in the mediastinum.  Agree with plan for steroid taper and empiric antibiotics.  No further thoracic surgical follow-up or CT scan of chest would be needed unless her condition changes.        @ 07/11/2019 12:17 PM

## 2019-07-11 NOTE — Plan of Care (Signed)
New care plan 

## 2019-07-15 LAB — CULTURE, BLOOD (ROUTINE X 2)
Culture: NO GROWTH
Culture: NO GROWTH
Special Requests: ADEQUATE

## 2019-09-17 ENCOUNTER — Encounter (HOSPITAL_BASED_OUTPATIENT_CLINIC_OR_DEPARTMENT_OTHER): Payer: Self-pay | Admitting: *Deleted

## 2019-09-17 ENCOUNTER — Other Ambulatory Visit: Payer: Self-pay

## 2019-09-17 ENCOUNTER — Emergency Department (HOSPITAL_BASED_OUTPATIENT_CLINIC_OR_DEPARTMENT_OTHER)
Admission: EM | Admit: 2019-09-17 | Discharge: 2019-09-17 | Disposition: A | Discharge status: Home / Self Care | Payer: 59 | Attending: Emergency Medicine | Admitting: Emergency Medicine

## 2019-09-17 DIAGNOSIS — Z202 Contact with and (suspected) exposure to infections with a predominantly sexual mode of transmission: Secondary | ICD-10-CM | POA: Diagnosis present

## 2019-09-17 DIAGNOSIS — Z711 Person with feared health complaint in whom no diagnosis is made: Secondary | ICD-10-CM

## 2019-09-17 DIAGNOSIS — Z87891 Personal history of nicotine dependence: Secondary | ICD-10-CM | POA: Diagnosis not present

## 2019-09-17 DIAGNOSIS — J45909 Unspecified asthma, uncomplicated: Secondary | ICD-10-CM | POA: Diagnosis not present

## 2019-09-17 MED ORDER — DOXYCYCLINE HYCLATE 100 MG PO CAPS
100.0000 mg | ORAL_CAPSULE | Freq: Two times a day (BID) | ORAL | 0 refills | Status: AC
Start: 2019-09-17 — End: 2019-09-24

## 2019-09-17 MED ORDER — LIDOCAINE HCL (PF) 1 % IJ SOLN
INTRAMUSCULAR | Status: AC
  Filled 2019-09-17: qty 5

## 2019-09-17 MED ORDER — CEFTRIAXONE SODIUM 500 MG IJ SOLR
500.0000 mg | Freq: Once | INTRAMUSCULAR | Status: AC
  Administered 2019-09-17: 500 mg via INTRAMUSCULAR
  Filled 2019-09-17: qty 500

## 2019-09-17 NOTE — ED Provider Notes (Signed)
[facility] EMERGENCY DEPARTMENT Provider Note   CSN: [id number] Arrival date & time: 09/17/19  1442     Judith Robinson is a 24 y.o. female.  HPI   Patient is a 24 year old female with a medical history as noted below.  Patient states she was told by her boyfriend that she needed to "get an STD check".  She states that he tested positive for gonorrhea.  She has no physical complaints at this time.  She states she recently finished her menstrual cycle.  No vaginal discharge, vaginal bleeding, abdominal pain, nausea, vomiting, pelvic pain, dysuria, hematuria.     Past Medical History:  Diagnosis Date  . Asthma   . History of pulmonary embolism     Patient Active Problem List   Diagnosis Date Noted  . Angioedema 07/10/2019  . Neck swelling 07/10/2019    Past Surgical History:  Procedure Laterality Date  . ADENOIDECTOMY    . TONSILLECTOMY       OB History   No obstetric history on file.     Family History  Problem Relation Age of Onset  . Multiple sclerosis Mother   . Thyroid disease Father        unknown    Social History   Tobacco Use  . Smoking status: Former Smoker    Types: E-cigarettes  . Smokeless tobacco: Never Used  Vaping Use  . Vaping Use: Every day  Substance Use Topics  . Alcohol use: No  . Drug use: No    Home Medications Prior to Admission medications   Medication Sig Start Date End Date Taking? Authorizing Provider  albuterol (VENTOLIN HFA) 108 (90 Base) MCG/ACT inhaler Inhale 2 puffs into the lungs every 6 (six) hours as needed for wheezing or shortness of breath. 07/11/19   Burnadette Pop, MD  doxycycline (VIBRAMYCIN) 100 MG capsule Take 1 capsule (100 mg total) by mouth 2 (two) times daily for 7 days. 09/17/19 09/24/19  Placido Sou, PA-C  ferrous sulfate 325 (65 FE) MG tablet Take 1 tablet (325 mg total) by mouth daily. 07/11/19 07/10/20  Burnadette Pop, MD  predniSONE (DELTASONE) 10 MG tablet Take 1 tablet (10 mg total) by mouth  daily. Take 60 mg tomorrow followed by 50 mg for 1 day then 40 mg for 1 day then 30 mg for 1 day then 20 mg for 1 day then 10 mg for 1  day then stop 07/11/19   Burnadette Pop, MD    Allergies    Patient has no known allergies.  Review of Systems   Review of Systems  Constitutional: Negative for chills and fever.  Gastrointestinal: Negative for abdominal pain, diarrhea, nausea and vomiting.  Genitourinary: Negative for dysuria, hematuria, pelvic pain, vaginal bleeding, vaginal discharge and vaginal pain.   Physical Exam Updated Vital Signs BP 122/76 (BP Location: Right Arm)   Pulse (!) 102   Temp 98.2 F (36.8 C) (Oral)   Resp 16   Ht 5\' 2"  (1.575 m)   Wt 79.8 kg   LMP 09/16/2019   SpO2 99%   BMI 32.19 kg/m   Physical Exam Vitals and nursing note reviewed.  Constitutional:      General: She is not in acute distress.    Appearance: Normal appearance. She is well-developed and normal weight. She is not ill-appearing, toxic-appearing or diaphoretic.  HENT:     Head: Normocephalic and atraumatic.     Right Ear: External ear normal.     Left Ear: External ear normal.  Eyes:     General: No scleral icterus.       Right eye: No discharge.        Left eye: No discharge.     Conjunctiva/sclera: Conjunctivae normal.  Neck:     Trachea: No tracheal deviation.  Cardiovascular:     Rate and Rhythm: Normal rate.  Pulmonary:     Effort: Pulmonary effort is normal. No respiratory distress.     Breath sounds: No stridor.  Abdominal:     General: There is no distension.  Musculoskeletal:        General: No swelling or deformity.     Cervical back: Neck supple.  Skin:    General: Skin is warm and dry.     Findings: No rash.  Neurological:     Mental Status: She is alert.     Cranial Nerves: Cranial nerve deficit: no gross deficits.    ED Results / Procedures / Treatments   Labs (all labs ordered are listed, but only abnormal results are displayed) Labs Reviewed   GC/CHLAMYDIA PROBE AMP ([place]) NOT AT [facility]   EKG None  Radiology No results found.  Procedures Procedures (including critical care time)  Medications Ordered in ED Medications  cefTRIAXone (ROCEPHIN) injection 500 mg (has no administration in time range)   ED Course  I have reviewed the triage vital signs and the nursing notes.  Pertinent labs & imaging results that were available during my care of the patient were reviewed by me and considered in my medical decision making (see chart for details).    MDM Rules/Calculators/A&P                          Pt is a 23 y.o. female that presents with a history, physical exam, and ED Clinical Course as noted above.   Patient presents today for STD check.  She is asymptomatic at this time.  Notes her boyfriend recently tested positive for gonorrhea.  Patient states she recently finished her menstrual cycle and believes she is "definitely not pregnant".  I informed the patient that doxycycline is contraindicated in pregnancy and recommended that she take a home pregnancy test if she was concerned.  I offered her a pregnancy test in the emergency department but she declined due to time.  Patient also declined the wet prep and HIV test noting that she does not want to wait on these lab tests.  We discussed the importance of this test in length and she once again declined.  We will have the patient obtain a gonorrhea/chlamydia test via self swab.  We will go ahead and treat with IM Rocephin here in the emergency department.  Will discharge with 1 week of doxycycline.  Patient is hemodynamically stable and in NAD at the time of d/c. Evaluation does not show pathology that would require ongoing emergent intervention or inpatient treatment. I explained the diagnosis to the patient. Patient is comfortable with above plan and is stable for discharge at this time. All questions were answered prior to disposition. Strict return precautions for  returning to the ED were discussed. Encouraged follow up with PCP.    An After Visit Summary was printed and given to the patient.  Patient discharged to home/self care.  Condition at discharge: Stable  Note: Portions of this report may have been transcribed using voice recognition software. Every effort was made to ensure accuracy; however, inadvertent computerized transcription errors may be present.  Final Clinical Impression(s) / ED Diagnoses Final diagnoses:  Concern about STD in female without diagnosis   Rx / DC Orders ED Discharge Orders         Ordered    doxycycline (VIBRAMYCIN) 100 MG capsule  2 times daily        09/17/19 1857           Placido Sou, PA-C 09/17/19 2345    Gwyneth Sprout, MD 09/19/19 2303

## 2019-09-17 NOTE — Discharge Instructions (Addendum)
I am prescribing you an antibiotic called doxycycline.  You are going to take this twice a day for the next 7 days.  Please do not stop taking this medication early.  Please return to the ER if you develop any new or worsening symptoms.  The antibiotic that I have prescribed you is harmful in pregnancy.  If you are concerned that you are pregnant at all, you need to take a home pregnancy test prior to taking this medication.  It was a pleasure to meet you.

## 2019-09-17 NOTE — ED Triage Notes (Signed)
STD exposure

## 2019-09-20 LAB — GC/CHLAMYDIA PROBE AMP (~~LOC~~) NOT AT ARMC
Chlamydia: NEGATIVE
Comment: NEGATIVE
Comment: NORMAL
Neisseria Gonorrhea: POSITIVE — AB

## 2019-10-08 ENCOUNTER — Emergency Department (HOSPITAL_BASED_OUTPATIENT_CLINIC_OR_DEPARTMENT_OTHER): Payer: 59

## 2019-10-08 ENCOUNTER — Emergency Department (HOSPITAL_BASED_OUTPATIENT_CLINIC_OR_DEPARTMENT_OTHER)
Admission: EM | Admit: 2019-10-08 | Discharge: 2019-10-08 | Disposition: A | Discharge status: Home / Self Care | Payer: 59 | Attending: Emergency Medicine | Admitting: Emergency Medicine

## 2019-10-08 ENCOUNTER — Encounter (HOSPITAL_BASED_OUTPATIENT_CLINIC_OR_DEPARTMENT_OTHER): Payer: Self-pay | Admitting: *Deleted

## 2019-10-08 ENCOUNTER — Other Ambulatory Visit: Payer: Self-pay

## 2019-10-08 DIAGNOSIS — R112 Nausea with vomiting, unspecified: Secondary | ICD-10-CM | POA: Insufficient documentation

## 2019-10-08 DIAGNOSIS — R197 Diarrhea, unspecified: Secondary | ICD-10-CM | POA: Diagnosis not present

## 2019-10-08 DIAGNOSIS — Z87891 Personal history of nicotine dependence: Secondary | ICD-10-CM | POA: Insufficient documentation

## 2019-10-08 DIAGNOSIS — R1084 Generalized abdominal pain: Secondary | ICD-10-CM | POA: Insufficient documentation

## 2019-10-08 DIAGNOSIS — J45909 Unspecified asthma, uncomplicated: Secondary | ICD-10-CM | POA: Diagnosis not present

## 2019-10-08 LAB — COMPREHENSIVE METABOLIC PANEL
ALT: 12 U/L (ref 0–44)
AST: 19 U/L (ref 15–41)
Albumin: 4.2 g/dL (ref 3.5–5.0)
Alkaline Phosphatase: 43 U/L (ref 38–126)
Anion gap: 11 (ref 5–15)
BUN: 10 mg/dL (ref 6–20)
CO2: 21 mmol/L — ABNORMAL LOW (ref 22–32)
Calcium: 9 mg/dL (ref 8.9–10.3)
Chloride: 99 mmol/L (ref 98–111)
Creatinine, Ser: 0.64 mg/dL (ref 0.44–1.00)
GFR calc Af Amer: >60 mL/min (ref >60)
GFR calc non Af Amer: >60 mL/min (ref >60)
Glucose, Bld: 112 mg/dL — ABNORMAL HIGH (ref 70–99)
Potassium: 3.3 mmol/L — ABNORMAL LOW (ref 3.5–5.1)
Sodium: 131 mmol/L — ABNORMAL LOW (ref 135–145)
Total Bilirubin: 1 mg/dL (ref 0.3–1.2)
Total Protein: 7.9 g/dL (ref 6.5–8.1)

## 2019-10-08 LAB — URINALYSIS, ROUTINE W REFLEX MICROSCOPIC
Bilirubin Urine: NEGATIVE
Glucose, UA: NEGATIVE mg/dL
Hgb urine dipstick: NEGATIVE
Ketones, ur: 40 mg/dL — AB
Leukocytes,Ua: NEGATIVE
Nitrite: NEGATIVE
Protein, ur: NEGATIVE mg/dL
Specific Gravity, Urine: 1.025 (ref 1.005–1.030)
pH: 6.5 (ref 5.0–8.0)

## 2019-10-08 LAB — CBC
HCT: 28.7 % — ABNORMAL LOW (ref 36.0–46.0)
Hemoglobin: 8.4 g/dL — ABNORMAL LOW (ref 12.0–15.0)
MCH: 20.5 pg — ABNORMAL LOW (ref 26.0–34.0)
MCHC: 29.3 g/dL — ABNORMAL LOW (ref 30.0–36.0)
MCV: 70.2 fL — ABNORMAL LOW (ref 80.0–100.0)
Platelets: 398 10*3/uL (ref 150–400)
RBC: 4.09 MIL/uL (ref 3.87–5.11)
RDW: 17.1 % — ABNORMAL HIGH (ref 11.5–15.5)
WBC: 25.4 10*3/uL — ABNORMAL HIGH (ref 4.0–10.5)
nRBC: 0 % (ref 0.0–0.2)

## 2019-10-08 LAB — PREGNANCY, URINE: Preg Test, Ur: NEGATIVE

## 2019-10-08 LAB — LIPASE, BLOOD: Lipase: 22 U/L (ref 11–51)

## 2019-10-08 MED ORDER — SODIUM CHLORIDE 0.9 % IV BOLUS
1000.0000 mL | Freq: Once | INTRAVENOUS | Status: AC
  Administered 2019-10-08: 1000 mL via INTRAVENOUS

## 2019-10-08 MED ORDER — ONDANSETRON 4 MG PO TBDP: 4.0000 mg | ORAL_TABLET | Freq: Three times a day (TID) | ORAL | 0 refills | Status: AC | PRN

## 2019-10-08 MED ORDER — LOPERAMIDE HCL 2 MG PO CAPS: 2.0000 mg | ORAL_CAPSULE | Freq: Four times a day (QID) | ORAL | 0 refills | Status: AC | PRN

## 2019-10-08 MED ORDER — IOHEXOL 300 MG/ML  SOLN
100.0000 mL | Freq: Once | INTRAMUSCULAR | Status: AC | PRN
  Administered 2019-10-08: 100 mL via INTRAVENOUS

## 2019-10-08 MED ORDER — MORPHINE SULFATE (PF) 4 MG/ML IV SOLN
4.0000 mg | Freq: Once | INTRAVENOUS | Status: AC
  Administered 2019-10-08: 4 mg via INTRAVENOUS
  Filled 2019-10-08: qty 1

## 2019-10-08 MED ORDER — ONDANSETRON HCL 4 MG/2ML IJ SOLN
4.0000 mg | Freq: Once | INTRAMUSCULAR | Status: AC
  Administered 2019-10-08: 4 mg via INTRAVENOUS
  Filled 2019-10-08: qty 2

## 2019-10-08 NOTE — Discharge Instructions (Signed)
You were seen in the emergency department for crampy abdominal pain nausea vomiting and diarrhea.  Your blood work showed that your infection cell count was high.  Your CAT scan showed evidence of inflammation in your bowels.  They also showed dilation of one of your fallopian tubes.  This will need to be followed up with an ultrasound.  We scheduled you for an ultrasound tomorrow morning at 9 AM.  We are prescribing some medications to help with your symptoms.  If you experience high fever or worsening abdominal pain please return to the emergency department.

## 2019-10-08 NOTE — ED Triage Notes (Signed)
Mid abdominal pain since last evening. Vomited x 3, diarrhea throughout the night.

## 2019-10-08 NOTE — ED Notes (Signed)
ED Provider at bedside. 

## 2019-10-08 NOTE — ED Provider Notes (Signed)
[facility] EMERGENCY DEPARTMENT Provider Note   CSN: [id number] Arrival date & time: 10/08/19  1404     History Chief Complaint  Patient presents with  . Abdominal Pain    Judith Robinson is a 24 y.o. female.  She is here with a complaint of generalized abdominal pain along with nausea vomiting and diarrhea that started around 7 PM last night.  She thinks she may have food poisoning.  No fevers or chills no cough no shortness of breath.  Just finished her.  No vaginal discharge.  Has chronic anemia has not noticed any excessive bleeding.  No sick contacts or recent travel.  The history is provided by the patient.  Abdominal Pain Pain location:  Generalized Pain quality: cramping   Pain radiates to:  Does not radiate Pain severity:  Moderate Onset quality:  Gradual Duration:  22 hours Timing:  Constant Progression:  Unchanged Chronicity:  New Context: not recent travel, not sick contacts and not trauma   Relieved by:  Nothing Worsened by:  Nothing Ineffective treatments:  None tried Associated symptoms: diarrhea, nausea and vomiting   Associated symptoms: no chest pain, no constipation, no cough, no dysuria, no fever, no hematemesis, no hematochezia, no hematuria, no melena, no shortness of breath, no sore throat and no vaginal discharge        Past Medical History:  Diagnosis Date  . Asthma   . History of pulmonary embolism     Patient Active Problem List   Diagnosis Date Noted  . Angioedema 07/10/2019  . Neck swelling 07/10/2019    Past Surgical History:  Procedure Laterality Date  . ADENOIDECTOMY    . TONSILLECTOMY       OB History   No obstetric history on file.     Family History  Problem Relation Age of Onset  . Multiple sclerosis Mother   . Thyroid disease Father        unknown    Social History   Tobacco Use  . Smoking status: Former Smoker    Types: E-cigarettes  . Smokeless tobacco: Never Used  Vaping Use  . Vaping Use: Every  day  Substance Use Topics  . Alcohol use: No  . Drug use: No    Home Medications Prior to Admission medications   Medication Sig Start Date End Date Taking? Authorizing Provider  albuterol (VENTOLIN HFA) 108 (90 Base) MCG/ACT inhaler Inhale 2 puffs into the lungs every 6 (six) hours as needed for wheezing or shortness of breath. 07/11/19  Yes Burnadette Pop, MD  ferrous sulfate 325 (65 FE) MG tablet Take 1 tablet (325 mg total) by mouth daily. 07/11/19 07/10/20 Yes Burnadette Pop, MD  predniSONE (DELTASONE) 10 MG tablet Take 1 tablet (10 mg total) by mouth daily. Take 60 mg tomorrow followed by 50 mg for 1 day then 40 mg for 1 day then 30 mg for 1 day then 20 mg for 1 day then 10 mg for 1  day then stop 07/11/19   Burnadette Pop, MD    Allergies    Patient has no known allergies.  Review of Systems   Review of Systems  Constitutional: Negative for fever.  HENT: Negative for sore throat.   Eyes: Negative for visual disturbance.  Respiratory: Negative for cough and shortness of breath.   Cardiovascular: Negative for chest pain.  Gastrointestinal: Positive for abdominal pain, diarrhea, nausea and vomiting. Negative for constipation, hematemesis, hematochezia and melena.  Genitourinary: Negative for dysuria, hematuria and vaginal discharge.  Musculoskeletal: Negative for back pain.  Skin: Negative for rash.  Neurological: Negative for headaches.    Physical Exam Updated Vital Signs BP 134/74   Pulse (!) 105   Temp 98.5 F (36.9 C) (Oral)   Resp 14   Ht 5\' 2"  (1.575 m)   Wt 79.8 kg   LMP 10/04/2019   SpO2 99%   BMI 32.19 kg/m   Physical Exam Vitals and nursing note reviewed.  Constitutional:      General: She is not in acute distress.    Appearance: Normal appearance. She is well-developed.  HENT:     Head: Normocephalic and atraumatic.  Eyes:     Conjunctiva/sclera: Conjunctivae normal.  Cardiovascular:     Rate and Rhythm: Normal rate and regular rhythm.     Heart  sounds: No murmur heard.   Pulmonary:     Effort: Pulmonary effort is normal. No respiratory distress.     Breath sounds: Normal breath sounds.  Abdominal:     Palpations: Abdomen is soft.     Tenderness: There is generalized abdominal tenderness. There is no guarding or rebound.  Musculoskeletal:        General: No deformity or signs of injury. Normal range of motion.     Cervical back: Neck supple.  Skin:    General: Skin is warm and dry.     Capillary Refill: Capillary refill takes less than 2 seconds.  Neurological:     General: No focal deficit present.     Mental Status: She is alert.     ED Results / Procedures / Treatments   Labs (all labs ordered are listed, but only abnormal results are displayed) Labs Reviewed  COMPREHENSIVE METABOLIC PANEL - Abnormal; Notable for the following components:      Result Value   Sodium 131 (*)    Potassium 3.3 (*)    CO2 21 (*)    Glucose, Bld 112 (*)    All other components within normal limits  CBC - Abnormal; Notable for the following components:   WBC 25.4 (*)    Hemoglobin 8.4 (*)    HCT 28.7 (*)    MCV 70.2 (*)    MCH 20.5 (*)    MCHC 29.3 (*)    RDW 17.1 (*)    All other components within normal limits  URINALYSIS, ROUTINE W REFLEX MICROSCOPIC - Abnormal; Notable for the following components:   Ketones, ur 40 (*)    All other components within normal limits  LIPASE, BLOOD  PREGNANCY, URINE    EKG None  Radiology CT Abdomen Pelvis W Contrast  Addendum Date: 10/08/2019   ADDENDUM REPORT: 10/08/2019 18:54 ADDENDUM: Correction to report, section on kidneys. There is mild scarring at the lower pole of the right kidney with coarse calcification. There are multiple small right-sided kidney stones measuring up to 5 mm in size. There is no hydronephrosis. Electronically Signed   By: Jasmine PangKim  Fujinaga M.D.   On: 10/08/2019 18:54   Result Date: 10/08/2019 CLINICAL DATA:  Diarrhea vomiting and nausea EXAM: CT ABDOMEN AND PELVIS  WITH CONTRAST TECHNIQUE: Multidetector CT imaging of the abdomen and pelvis was performed using the standard protocol following bolus administration of intravenous contrast. CONTRAST:  100mL OMNIPAQUE IOHEXOL 300 MG/ML  SOLN COMPARISON:  None. FINDINGS: Lower chest: Lung bases demonstrate focal atelectasis at the left base. No pleural effusion. Normal heart size. Hepatobiliary: No focal liver abnormality is seen. No gallstones, gallbladder wall thickening, or biliary dilatation. Pancreas: Unremarkable. No pancreatic  ductal dilatation or surrounding inflammatory changes. Spleen: Normal in size without focal abnormality. Adrenals/Urinary Tract: Adrenal glands are unremarkable. Kidneys are normal, without renal calculi, focal lesion, or hydronephrosis. Bladder is unremarkable. Stomach/Bowel: Stomach is nonenlarged. No dilated small bowel. Possible thickening of distal small bowel loops within the anterior pelvis with hazy edema or soft tissue stranding in the anterior pelvic fat. The appendix is slightly generous in size, measuring up to 7 mm but is otherwise without convincing evidence for appendicitis. Vascular/Lymphatic: Nonaneurysmal aorta. Multiple subcentimeter Peri aortic lymph nodes. Reproductive: Uterus unremarkable. Oblong hypodensity within the right pelvis measuring 2 cm transverse by 8.5 cm in length, along the right aspect of the uterus. The configuration suggests possible dilated fallopian tube. Some increased density values within the tubular structure. There may be smaller appearing similar structure in the left adnexa. Other: No free air or free fluid. Musculoskeletal: No acute or significant osseous findings. IMPRESSION: 1. Mild soft tissue stranding and hazy infiltration of the anterior pelvic fat, potentially centered about nondilated distal small bowel loops which demonstrate questionable mild wall thickening. Consider enteritis. Appendix upper normal in size to slightly enlarged but no other  suspicious inflammatory features. 2. Oblong hypodensity within the right pelvis with morphology suggestive of dilated fallopian tube, slight increased internal density values suggest that it may be complicated by hemorrhage or debris. Loculated adnexal fluid collection could also be considered. There may be smaller similar-appearing structure in the left adnexa. Correlation with pelvic ultrasound may be considered with urgency dependent upon clinical presentation. Electronically Signed: By: Jasmine Pang M.D. On: 10/08/2019 18:00   US PELVIC COMPLETE W TRANSVAGINAL AND TORSION R/O  Result Date: 10/09/2019 CLINICAL DATA:  Right lower quadrant abdominal pain. EXAM: TRANSABDOMINAL AND TRANSVAGINAL ULTRASOUND OF PELVIS DOPPLER ULTRASOUND OF OVARIES TECHNIQUE: Both transabdominal and transvaginal ultrasound examinations of the pelvis were performed. Transabdominal technique was performed for global imaging of the pelvis including uterus, ovaries, adnexal regions, and pelvic cul-de-sac. It was necessary to proceed with endovaginal exam following the transabdominal exam to visualize the endometrium and ovaries. Color and duplex Doppler ultrasound was utilized to evaluate blood flow to the ovaries. COMPARISON:  October 08, 2019. FINDINGS: Uterus Measurements: 7.7 x 6.4 x 5.4 cm = volume: 140 mL. No fibroids or other mass visualized. Endometrium Thickness: 3 mm which is within normal limits. No focal abnormality visualized. Right ovary Measurements: 4.9 x 5.0 x 2.4 cm = volume: 31 mL. Normal appearance/no adnexal mass. Left ovary Measurements: 4.6 x 2.8 x 2.3 cm = volume: 15 mL. Normal appearance/no adnexal mass. Pulsed Doppler evaluation of both ovaries demonstrates normal low-resistance arterial and venous waveforms. Other findings Moderate amount of free fluid is noted in the pelvis, most prominently seen in the right adnexal region. IMPRESSION: Moderate amount of free fluid is noted in the pelvis, most prominently  seen in the right adnexal region. No evidence of ovarian torsion or mass. No other abnormality seen in the pelvis. Electronically Signed   By: Lupita Raider M.D.   On: 10/09/2019 10:37    Procedures Procedures (including critical care time)  Medications Ordered in ED Medications  sodium chloride 0.9 % bolus 1,000 mL (has no administration in time range)  ondansetron (ZOFRAN) injection 4 mg (has no administration in time range)  morphine 4 MG/ML injection 4 mg (has no administration in time range)    ED Course  I have reviewed the triage vital signs and the nursing notes.  Pertinent labs & imaging results that were available  during my care of the patient were reviewed by me and considered in my medical decision making (see chart for details).  Clinical Course as of Oct 08 1108  Fri Oct 08, 2019  4098 Reviewed the results of the patient's lab work and CT results with patient.  Clinically she says she feels a lot better.  She has been tolerating p.o.  Ultrasound not available at this time so patient is in agreement to have an ultrasound done tomorrow to follow-up on the fallopian tube finding.   [MB]    Clinical Course User Index [MB] Terrilee Files, MD   MDM Rules/Calculators/A&P                         This patient complains of generalized abdominal cramping in the setting of nausea vomiting and diarrhea; this involves an extensive number of treatment Options and is a complaint that carries with it a high risk of complications and Morbidity. The differential includes gastroenteritis, obstruction, colitis, renal colic, cholelithiasis, appendicitis, diverticulitis, metabolic derangement  I ordered, reviewed and interpreted labs, which included CBC with markedly elevated white count, low hemoglobin, chemistries with mildly low potassium and bicarb reflecting some volume loss, urine with ketones, pregnancy test negative I ordered medication IV fluids and nausea medication pain  medication with improvement in her symptoms I ordered imaging studies which included CT abdomen and pelvis.  And I independently    visualized and interpreted imaging which showed some thickening of small bowel loops possibly reflecting enteritis.  Radiology also comments upon some dilation of her fallopian tube that will need follow-up Previous records obtained and reviewed in epic, no recent visits  After the interventions stated above, I reevaluated the patient and found patient to be clinically much better.  Tachycardia resolved.  She is tolerating p.o.  Abdomen remains soft without any focal tenderness.  I reviewed the results with her and the need for follow-up ultrasound.  Unfortunately ultrasound is not available at this time and I do not think her exam warrants transfer for emergent ultrasound.  She is comfortable plan for returning tomorrow to get an ultrasound in the morning.  Return instructions discussed.   Final Clinical Impression(s) / ED Diagnoses Final diagnoses:  Nausea vomiting and diarrhea  Generalized abdominal pain    Rx / DC Orders ED Discharge Orders         Ordered    loperamide (IMODIUM) 2 MG capsule  4 times daily PRN        10/08/19 1837    ondansetron (ZOFRAN ODT) 4 MG disintegrating tablet  Every 8 hours PRN        10/08/19 1837    US PELVIC COMPLETE W TRANSVAGINAL AND TORSION R/O        10/08/19 1837           Terrilee Files, MD 10/09/19 1114

## 2019-10-09 ENCOUNTER — Ambulatory Visit (HOSPITAL_BASED_OUTPATIENT_CLINIC_OR_DEPARTMENT_OTHER)
Admission: RE | Admit: 2019-10-09 | Discharge: 2019-10-09 | Disposition: A | Discharge status: Home / Self Care | Payer: 59 | Source: Ambulatory Visit | Attending: Emergency Medicine | Admitting: Emergency Medicine

## 2019-10-09 DIAGNOSIS — R1031 Right lower quadrant pain: Secondary | ICD-10-CM | POA: Diagnosis not present

## 2019-10-09 NOTE — ED Provider Notes (Signed)
Seen yesterday by Dr. Charm Barges.  Had presented for generalized abdominal pain with nausea, vomiting and diarrhea.  Was noted to have leukocytosis of 25.6.  CT imaging showed soft tissue stranding and hazy infiltrate with questionable mild wall thickening.  Consider enteritis.  Appendix upper normal in size but no other inflammatory changes of the appendix to suggest appendicitis.  She did have an oblong hypodensity in the right pelvis suggestive of a dilated fallopian tube.  Had internal densities suggesting debris or hemorrhage.  Could not exclude loculated fluid collection.  They recommended ultrasound for further evaluation  Ultrasound today 10/09/2019 showed free fluid in the pelvis more prominently in the right adnexal region. No evidence ovarian torsion or mass.  No loculated fluid collection to suggest TOA.   Patient does not appear ill on my exam. Discussed with patient her imaging results.  Her nausea, vomiting and diarrhea have resolved.  Still has some mild generalized abdominal pain however patient reiterates this is "all over" is not located to her lower abd quadrants. I discussed with patient additional symptomatic management at home as she has not filled the Rx provider by Dr. Charm Barges last night.  Discussed close follow-up with PCP or return to ED for any worsening or persistent symptoms.   Linwood Dibbles, PA-C 10/09/19 1109    Terrilee Files, MD 10/09/19 1115

## 2020-01-21 ENCOUNTER — Encounter (HOSPITAL_BASED_OUTPATIENT_CLINIC_OR_DEPARTMENT_OTHER): Payer: Self-pay

## 2020-01-21 ENCOUNTER — Emergency Department (HOSPITAL_BASED_OUTPATIENT_CLINIC_OR_DEPARTMENT_OTHER)
Admission: EM | Admit: 2020-01-21 | Discharge: 2020-01-21 | Disposition: A | Discharge status: Home / Self Care | Payer: 59 | Attending: Emergency Medicine | Admitting: Emergency Medicine

## 2020-01-21 ENCOUNTER — Other Ambulatory Visit: Payer: Self-pay

## 2020-01-21 DIAGNOSIS — Z87891 Personal history of nicotine dependence: Secondary | ICD-10-CM | POA: Insufficient documentation

## 2020-01-21 DIAGNOSIS — Y9384 Activity, sleeping: Secondary | ICD-10-CM | POA: Insufficient documentation

## 2020-01-21 DIAGNOSIS — X58XXXA Exposure to other specified factors, initial encounter: Secondary | ICD-10-CM | POA: Insufficient documentation

## 2020-01-21 DIAGNOSIS — T162XXA Foreign body in left ear, initial encounter: Secondary | ICD-10-CM | POA: Insufficient documentation

## 2020-01-21 DIAGNOSIS — R400 Somnolence: Secondary | ICD-10-CM | POA: Insufficient documentation

## 2020-01-21 DIAGNOSIS — J45909 Unspecified asthma, uncomplicated: Secondary | ICD-10-CM | POA: Insufficient documentation

## 2020-01-21 MED ORDER — CIPROFLOXACIN-DEXAMETHASONE 0.3-0.1 % OT SUSP: 4.0000 [drp] | Freq: Two times a day (BID) | OTIC | 0 refills | Status: DC

## 2020-01-21 MED ORDER — NEOMYCIN-POLYMYXIN-HC 3.5-10000-1 OT SUSP: 4.0000 [drp] | Freq: Three times a day (TID) | OTIC | 0 refills | Status: AC

## 2020-01-21 NOTE — ED Notes (Signed)
Earring piece came out.

## 2020-01-21 NOTE — Discharge Instructions (Addendum)
Call your primary care doctor or specialist as discussed in the next 2-3 days.   Return immediately back to the ER if:  Your symptoms worsen within the next 12-24 hours. You develop new symptoms such as new fevers, persistent vomiting, new pain, shortness of breath, or new weakness or numbness, or if you have any other concerns.  

## 2020-01-21 NOTE — ED Provider Notes (Signed)
[facility] EMERGENCY DEPARTMENT Provider Note   CSN: [id number] Arrival date & time: 01/21/20  0954     History Chief Complaint  Patient presents with  . Foreign Body in Ear    Judith Robinson is a 25 y.o. female.  Patient woke up this morning missing her left earring, she felt it inside her ear and noticed that he had fallen inside her ear while she was sleeping.  She tried to get it out by try to flush it out but was unsuccessful and presents to the ER for foreign body in her left ear.  Denies headache fever vomiting cough or diarrhea.        Past Medical History:  Diagnosis Date  . Asthma   . History of pulmonary embolism     Patient Active Problem List   Diagnosis Date Noted  . Angioedema 07/10/2019  . Neck swelling 07/10/2019    Past Surgical History:  Procedure Laterality Date  . ADENOIDECTOMY    . TONSILLECTOMY       OB History   No obstetric history on file.     Family History  Problem Relation Age of Onset  . Multiple sclerosis Mother   . Thyroid disease Father        unknown    Social History   Tobacco Use  . Smoking status: Former Smoker    Types: E-cigarettes  . Smokeless tobacco: Never Used  Vaping Use  . Vaping Use: Every day  Substance Use Topics  . Alcohol use: No  . Drug use: No    Home Medications Prior to Admission medications   Medication Sig Start Date End Date Taking? Authorizing Provider  ciprofloxacin-dexamethasone (CIPRODEX) OTIC suspension Place 4 drops into the left ear 2 (two) times daily for 7 days. 01/21/20 01/28/20 Yes Cheryll Cockayne, MD  albuterol (VENTOLIN HFA) 108 (90 Base) MCG/ACT inhaler Inhale 2 puffs into the lungs every 6 (six) hours as needed for wheezing or shortness of breath. 07/11/19   Burnadette Pop, MD  ferrous sulfate 325 (65 FE) MG tablet Take 1 tablet (325 mg total) by mouth daily. 07/11/19 07/10/20  Burnadette Pop, MD  loperamide (IMODIUM) 2 MG capsule Take 1 capsule (2 mg total) by mouth 4  (four) times daily as needed for diarrhea or loose stools. 10/08/19   Terrilee Files, MD  ondansetron (ZOFRAN ODT) 4 MG disintegrating tablet Take 1 tablet (4 mg total) by mouth every 8 (eight) hours as needed for nausea or vomiting. 10/08/19   Terrilee Files, MD  predniSONE (DELTASONE) 10 MG tablet Take 1 tablet (10 mg total) by mouth daily. Take 60 mg tomorrow followed by 50 mg for 1 day then 40 mg for 1 day then 30 mg for 1 day then 20 mg for 1 day then 10 mg for 1  day then stop 07/11/19   Burnadette Pop, MD    Allergies    Patient has no known allergies.  Review of Systems   Review of Systems  Constitutional: Negative for fever.  HENT: Negative for ear pain.   Eyes: Negative for pain.  Respiratory: Negative for cough.   Cardiovascular: Negative for chest pain.  Gastrointestinal: Negative for abdominal pain.  Genitourinary: Negative for flank pain.  Musculoskeletal: Negative for back pain.  Skin: Negative for rash.  Neurological: Negative for headaches.    Physical Exam Updated Vital Signs BP 121/86 (BP Location: Left Arm)   Pulse 89   Temp 98.2 F (36.8 C) (Oral)  Resp 18   Ht 5\' 2"  (1.575 m)   Wt 77.1 kg   SpO2 100%   BMI 31.09 kg/m   Physical Exam Constitutional:      General: She is not in acute distress.    Appearance: Normal appearance.  HENT:     Head: Normocephalic.     Right Ear: Tympanic membrane normal.     Left Ear: Tympanic membrane normal.     Ears:     Comments: Left ear canal has a metal earring and seen on exam.  Scant amount of blood seen along the wall of the external canal.  After removal of the foreign body, no injury to tympanic membrane visible.    Nose: Nose normal.  Eyes:     Extraocular Movements: Extraocular movements intact.  Cardiovascular:     Rate and Rhythm: Normal rate.  Pulmonary:     Effort: Pulmonary effort is normal.  Musculoskeletal:        General: Normal range of motion.     Cervical back: Normal range of motion.   Neurological:     General: No focal deficit present.     Mental Status: She is alert. Mental status is at baseline.     ED Results / Procedures / Treatments   Labs (all labs ordered are listed, but only abnormal results are displayed) Labs Reviewed - No data to display  EKG None  Radiology No results found.  Procedures .Foreign Body Removal  Date/Time: 01/21/2020 11:33 AM Performed by: 01/23/2020, MD Authorized by: Cheryll Cockayne, MD  Comments: Lighted curette manipulated to dislodge left ear foreign body free, subsequently, foreign body able to be shaken out with head tilt.   (including critical care time)  Medications Ordered in ED Medications - No data to display  ED Course  I have reviewed the triage vital signs and the nursing notes.  Pertinent labs & imaging results that were available during my care of the patient were reviewed by me and considered in my medical decision making (see chart for details).    MDM Rules/Calculators/A&P                          Lighted ear curette was used to manipulate piece of earring that was in the left ear.  Subsequently the patient was able to shake the earring out by tipping her head to the side.  Advising eardrops given she has some mild abrasions and some bleeding in the external canal.  Advise follow-up with ENT within the week.  Advised immediate return for worsening pain fevers or any additional concerns.   Final Clinical Impression(s) / ED Diagnoses Final diagnoses:  Foreign body of left ear, initial encounter    Rx / DC Orders ED Discharge Orders         Ordered    ciprofloxacin-dexamethasone (CIPRODEX) OTIC suspension  2 times daily        01/21/20 1137           01/23/20, MD 01/21/20 1138

## 2020-01-21 NOTE — ED Notes (Signed)
ED Provider at bedside. 

## 2020-01-21 NOTE — ED Triage Notes (Signed)
Pt states her earring is stuck in her L ear. She noticed it this morning after waking up. Part of earring noted in L ear in triage.

## 2020-01-30 ENCOUNTER — Emergency Department (HOSPITAL_BASED_OUTPATIENT_CLINIC_OR_DEPARTMENT_OTHER)
Admission: EM | Admit: 2020-01-30 | Discharge: 2020-01-30 | Disposition: A | Discharge status: Home / Self Care | Payer: HRSA Program | Attending: Emergency Medicine | Admitting: Emergency Medicine

## 2020-01-30 ENCOUNTER — Encounter (HOSPITAL_BASED_OUTPATIENT_CLINIC_OR_DEPARTMENT_OTHER): Payer: Self-pay

## 2020-01-30 ENCOUNTER — Emergency Department (HOSPITAL_BASED_OUTPATIENT_CLINIC_OR_DEPARTMENT_OTHER): Payer: HRSA Program

## 2020-01-30 ENCOUNTER — Other Ambulatory Visit: Payer: Self-pay

## 2020-01-30 DIAGNOSIS — R0781 Pleurodynia: Secondary | ICD-10-CM

## 2020-01-30 DIAGNOSIS — R0602 Shortness of breath: Secondary | ICD-10-CM | POA: Insufficient documentation

## 2020-01-30 DIAGNOSIS — R071 Chest pain on breathing: Secondary | ICD-10-CM | POA: Insufficient documentation

## 2020-01-30 DIAGNOSIS — Z87891 Personal history of nicotine dependence: Secondary | ICD-10-CM | POA: Insufficient documentation

## 2020-01-30 DIAGNOSIS — Z20822 Contact with and (suspected) exposure to covid-19: Secondary | ICD-10-CM | POA: Diagnosis not present

## 2020-01-30 DIAGNOSIS — R000 Tachycardia, unspecified: Secondary | ICD-10-CM | POA: Diagnosis not present

## 2020-01-30 DIAGNOSIS — J45909 Unspecified asthma, uncomplicated: Secondary | ICD-10-CM | POA: Diagnosis not present

## 2020-01-30 DIAGNOSIS — D649 Anemia, unspecified: Secondary | ICD-10-CM

## 2020-01-30 LAB — PREGNANCY, URINE: Preg Test, Ur: NEGATIVE

## 2020-01-30 LAB — CBC
HCT: 28.8 % — ABNORMAL LOW (ref 36.0–46.0)
Hemoglobin: 8.1 g/dL — ABNORMAL LOW (ref 12.0–15.0)
MCH: 18.7 pg — ABNORMAL LOW (ref 26.0–34.0)
MCHC: 28.1 g/dL — ABNORMAL LOW (ref 30.0–36.0)
MCV: 66.4 fL — ABNORMAL LOW (ref 80.0–100.0)
Platelets: 366 10*3/uL (ref 150–400)
RBC: 4.34 MIL/uL (ref 3.87–5.11)
RDW: 20 % — ABNORMAL HIGH (ref 11.5–15.5)
WBC: 8.7 10*3/uL (ref 4.0–10.5)
nRBC: 0 % (ref 0.0–0.2)

## 2020-01-30 LAB — BASIC METABOLIC PANEL
Anion gap: 11 (ref 5–15)
BUN: 8 mg/dL (ref 6–20)
CO2: 23 mmol/L (ref 22–32)
Calcium: 9.6 mg/dL (ref 8.9–10.3)
Chloride: 102 mmol/L (ref 98–111)
Creatinine, Ser: 0.53 mg/dL (ref 0.44–1.00)
GFR, Estimated: >60 mL/min (ref >60)
Glucose, Bld: 107 mg/dL — ABNORMAL HIGH (ref 70–99)
Potassium: 3.5 mmol/L (ref 3.5–5.1)
Sodium: 136 mmol/L (ref 135–145)

## 2020-01-30 MED ORDER — CYCLOBENZAPRINE HCL 5 MG PO TABS: 5.0000 mg | ORAL_TABLET | Freq: Three times a day (TID) | ORAL | 0 refills | Status: AC | PRN

## 2020-01-30 MED ORDER — KETOROLAC TROMETHAMINE 30 MG/ML IJ SOLN
30.0000 mg | Freq: Once | INTRAMUSCULAR | Status: AC
  Administered 2020-01-30: 30 mg via INTRAVENOUS
  Filled 2020-01-30: qty 1

## 2020-01-30 MED ORDER — IOHEXOL 350 MG/ML SOLN
100.0000 mL | Freq: Once | INTRAVENOUS | Status: AC | PRN
  Administered 2020-01-30: 100 mL via INTRAVENOUS

## 2020-01-30 NOTE — ED Triage Notes (Addendum)
Pt arrives complaining of shortness of breath since yesterday, started intermittently now is constant. Is having sharp pain under right ribcage which is "exactly the same" as when she had a prior PE in 2017. Pain worse with deep inspiration

## 2020-01-30 NOTE — ED Provider Notes (Signed)
[facility] EMERGENCY DEPARTMENT Provider Note   CSN: [id number] Arrival date & time: 01/30/20  1832     History Chief Complaint  Patient presents with  . Shortness of Breath    Judith Robinson is a 25 y.o. female history of PE here presenting with shortness of breath.  Patient has pleuritic chest pain and shortness of breath since yesterday. Patient states that this is similar to her previous PE in 2017.  She is no longer on blood thinners.  Patient does admit to a possible COVID exposure recently but tested negative for COVID.  She is also fully vaccinated for COVID.  She denies any cough or fevers.  The history is provided by the patient.       Past Medical History:  Diagnosis Date  . Asthma   . History of pulmonary embolism     Patient Active Problem List   Diagnosis Date Noted  . Angioedema 07/10/2019  . Neck swelling 07/10/2019    Past Surgical History:  Procedure Laterality Date  . ADENOIDECTOMY    . TONSILLECTOMY       OB History   No obstetric history on file.     Family History  Problem Relation Age of Onset  . Multiple sclerosis Mother   . Thyroid disease Father        unknown    Social History   Tobacco Use  . Smoking status: Former Smoker    Types: E-cigarettes  . Smokeless tobacco: Never Used  Vaping Use  . Vaping Use: Every day  Substance Use Topics  . Alcohol use: No  . Drug use: No    Home Medications Prior to Admission medications   Medication Sig Start Date End Date Taking? Authorizing Provider  albuterol (VENTOLIN HFA) 108 (90 Base) MCG/ACT inhaler Inhale 2 puffs into the lungs every 6 (six) hours as needed for wheezing or shortness of breath. 07/11/19   Burnadette Pop, MD  ferrous sulfate 325 (65 FE) MG tablet Take 1 tablet (325 mg total) by mouth daily. 07/11/19 07/10/20  Burnadette Pop, MD  loperamide (IMODIUM) 2 MG capsule Take 1 capsule (2 mg total) by mouth 4 (four) times daily as needed for diarrhea or loose stools.  10/08/19   Terrilee Files, MD  ondansetron (ZOFRAN ODT) 4 MG disintegrating tablet Take 1 tablet (4 mg total) by mouth every 8 (eight) hours as needed for nausea or vomiting. 10/08/19   Terrilee Files, MD  predniSONE (DELTASONE) 10 MG tablet Take 1 tablet (10 mg total) by mouth daily. Take 60 mg tomorrow followed by 50 mg for 1 day then 40 mg for 1 day then 30 mg for 1 day then 20 mg for 1 day then 10 mg for 1  day then stop 07/11/19   Burnadette Pop, MD    Allergies    Patient has no known allergies.  Review of Systems   Review of Systems  Respiratory: Positive for shortness of breath.   All other systems reviewed and are negative.   Physical Exam Updated Vital Signs BP 125/70 (BP Location: Right Arm)   Pulse (!) 105   Temp 98.6 F (37 C) (Oral)   Resp 16   Ht 5\' 2"  (1.575 m)   Wt 77.1 kg   LMP 01/24/2020   SpO2 100%   BMI 31.09 kg/m   Physical Exam Vitals and nursing note reviewed.  Constitutional:      Comments: Slightly uncomfortable   HENT:     Head:  Normocephalic.     Mouth/Throat:     Mouth: Mucous membranes are moist.  Eyes:     Extraocular Movements: Extraocular movements intact.     Pupils: Pupils are equal, round, and reactive to light.  Cardiovascular:     Rate and Rhythm: Normal rate and regular rhythm.  Pulmonary:     Comments: Slightly tachypneic, mild R lower rib tenderness  Abdominal:     General: Abdomen is flat.     Palpations: Abdomen is soft.     Comments: nontender   Musculoskeletal:        General: Normal range of motion.     Cervical back: Normal range of motion and neck supple.  Skin:    General: Skin is warm.     Capillary Refill: Capillary refill takes less than 2 seconds.  Neurological:     General: No focal deficit present.     Mental Status: She is oriented to person, place, and time.  Psychiatric:        Mood and Affect: Mood normal.        Behavior: Behavior normal.     ED Results / Procedures / Treatments   Labs (all  labs ordered are listed, but only abnormal results are displayed) Labs Reviewed  BASIC METABOLIC PANEL - Abnormal; Notable for the following components:      Result Value   Glucose, Bld 107 (*)    All other components within normal limits  CBC - Abnormal; Notable for the following components:   Hemoglobin 8.1 (*)    HCT 28.8 (*)    MCV 66.4 (*)    MCH 18.7 (*)    MCHC 28.1 (*)    RDW 20.0 (*)    All other components within normal limits  SARS CORONAVIRUS 2 (TAT 6-24 HRS)  PREGNANCY, URINE    EKG None  Radiology CT Angio Chest PE W and/or Wo Contrast  Result Date: 01/30/2020 CLINICAL DATA:  Pulmonary embolus suspected with high probability. EXAM: CT ANGIOGRAPHY CHEST WITH CONTRAST TECHNIQUE: Multidetector CT imaging of the chest was performed using the standard protocol during bolus administration of intravenous contrast. Multiplanar CT image reconstructions and MIPs were obtained to evaluate the vascular anatomy. CONTRAST:  OMNIPAQUE IOHEXOL 350 MG/ML SOLN COMPARISON:  07/10/2019 FINDINGS: Cardiovascular: Good opacification of the central and segmental pulmonary arteries. No focal filling defects. No evidence of significant pulmonary embolus. Normal heart size. No pericardial effusions. Normal caliber thoracic aorta. No aneurysm. Mediastinum/Nodes: Residual thymic tissue in the anterior mediastinum. No significant lymphadenopathy. Esophagus is decompressed. Lungs/Pleura: Patchy mosaic attenuation pattern to the lungs may indicate motion artifact, air trapping, or airways disease. No focal consolidation or infiltration. No pleural effusions. No pneumothorax. Airways are patent. Upper Abdomen: No acute abnormalities demonstrated in the visualized upper abdomen. Musculoskeletal: No chest wall abnormality. No acute or significant osseous findings. Review of the MIP images confirms the above findings. IMPRESSION: 1. No evidence of significant pulmonary embolus. 2. Patchy mosaic attenuation  pattern to the lungs may indicate motion artifact, air trapping, or airways disease. Electronically Signed   By: Burman Nieves M.D.   On: 01/30/2020 19:59    Procedures Procedures (including critical care time)  Medications Ordered in ED Medications  ketorolac (TORADOL) 30 MG/ML injection 30 mg (has no administration in time range)  iohexol (OMNIPAQUE) 350 MG/ML injection 100 mL (100 mLs Intravenous Contrast Given 01/30/20 1949)    ED Course  I have reviewed the triage vital signs and the nursing notes.  Pertinent labs & imaging results that were available during my care of the patient were reviewed by me and considered in my medical decision making (see chart for details).    MDM Rules/Calculators/A&P                          Akaysha Cobern is a 25 y.o. female here with pleuritic chest pain.  Patient has history of PE in the past.  Patient also is tachycardic on arrival.  CTA did not show PE but has a pattern consistent of possible COVID.  Patient is not hypoxic.  Patient did receive COVID-vaccine.  Patient's heart rate went down to 105 after some Toradol.  She has no right upper quadrant tenderness to suggest biliary colic and patient CT did not show any obvious gallbladder pathology.  I think patient likely has COVID versus musculoskeletal pain.  COVID test is sent.  Told patient to stay home and take Tylenol or Motrin for pain.  Final Clinical Impression(s) / ED Diagnoses Final diagnoses:  None    Rx / DC Orders ED Discharge Orders    None       Charlynne Pander, MD 01/30/20 2048

## 2020-01-30 NOTE — Discharge Instructions (Addendum)
You may have muscle strain or COVID.  You can take Tylenol or Motrin for pain.  You may take Flexeril for muscle spasms.  You were tested for COVID please stay home until result comes back.  If you are positive please stay home for about 10 days.   You are anemic but this is unchanged from previous  See your doctor for follow-up  If you have COVID you may qualify for infusion. I have referred you to infusion clinic.   Return to ER if you have worse chest pain, abdominal pain, trouble breathing, fever, shortness of breath

## 2020-01-31 LAB — SARS CORONAVIRUS 2 (TAT 6-24 HRS): SARS Coronavirus 2: NEGATIVE

## 2020-05-11 ENCOUNTER — Ambulatory Visit (HOSPITAL_COMMUNITY): Payer: Self-pay

## 2020-08-15 DIAGNOSIS — Z113 Encounter for screening for infections with a predominantly sexual mode of transmission: Secondary | ICD-10-CM | POA: Diagnosis not present

## 2022-01-17 IMAGING — CT CT ANGIO CHEST
3 of 11 series · 17 of 36 positions shown · IV contrast (omnipaque)
Comparison: None.

CLINICAL DATA: Assess for pulmonary embolus.

EXAM:
CT ANGIOGRAPHY CHEST WITH CONTRAST
TECHNIQUE: Multidetector CT imaging of the chest was performed using the
standard protocol during bolus administration of intravenous
contrast. Multiplanar CT image reconstructions and MIPs were
obtained to evaluate the vascular anatomy.
CONTRAST:  100mL OMNIPAQUE IOHEXOL 350 MG/ML SOLN

[Series 8: pe thins · axial · 0.77mm/px · z∈[-386,-168]mm · 14 of 252 slices shown]
[im 17/252  lung]
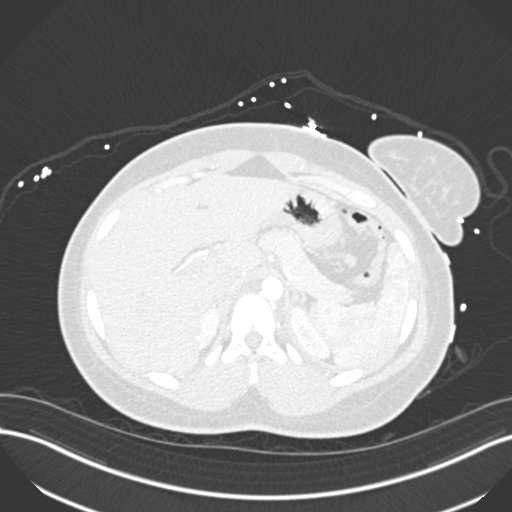
[im 34/252  mediastinal]
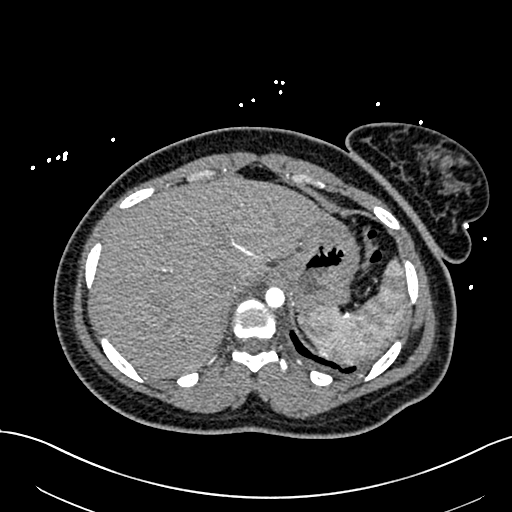
[im 51/252  lung]
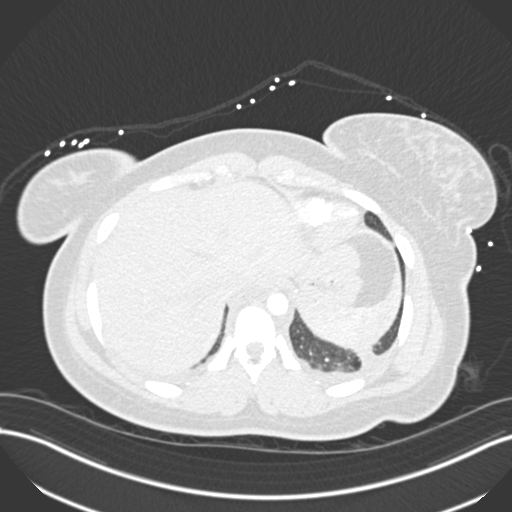
[im 67/252  mediastinal]
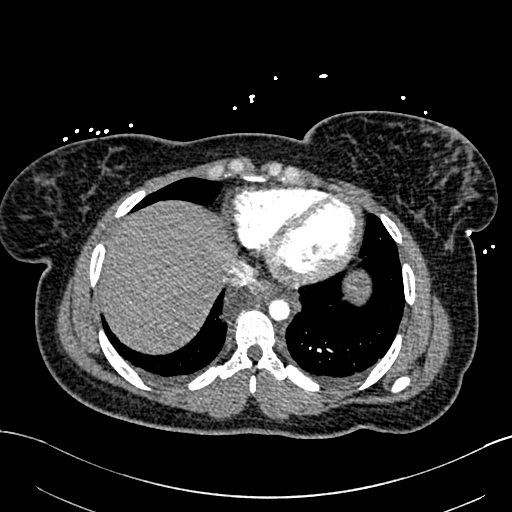
[im 84/252  lung]
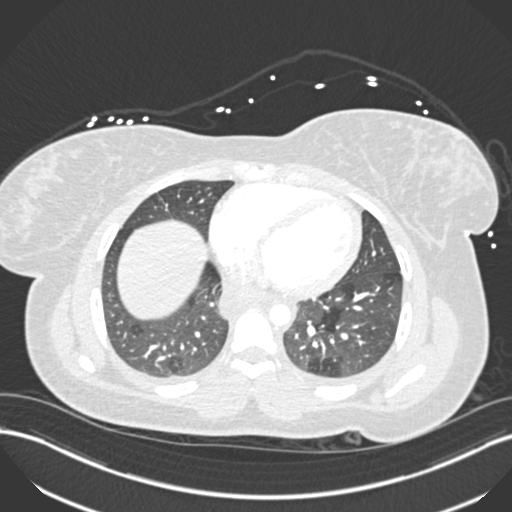
[im 101/252  mediastinal]
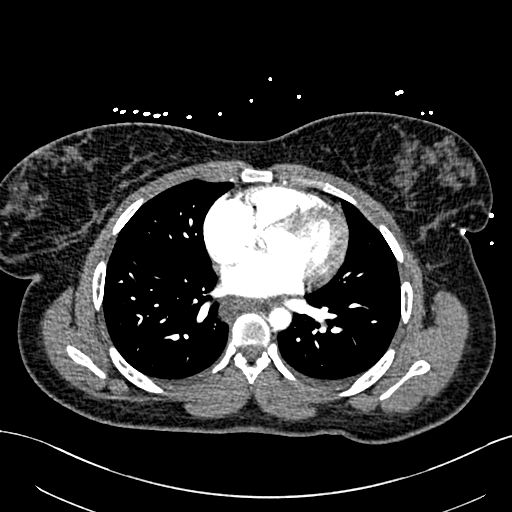
[im 118/252  lung]
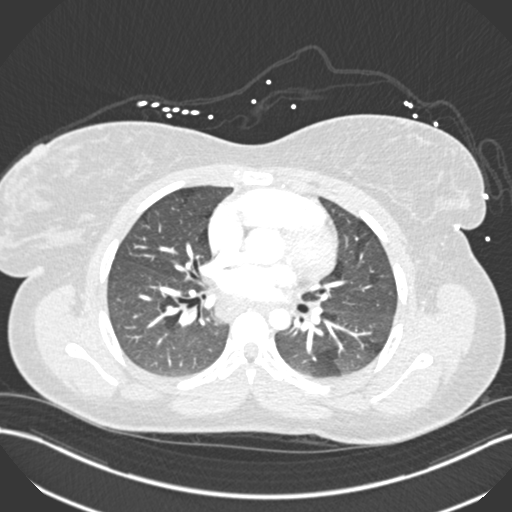
[im 134/252  mediastinal]
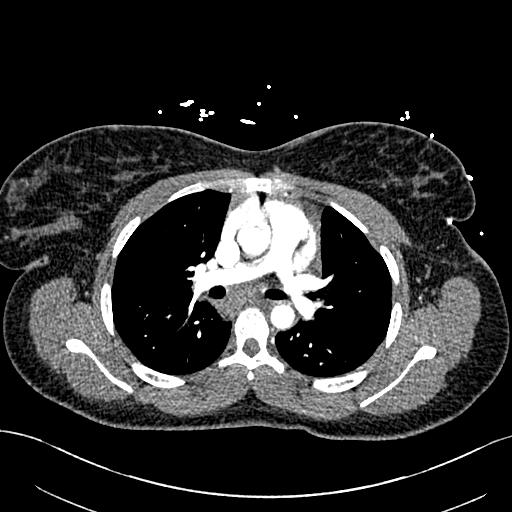
[im 151/252  lung]
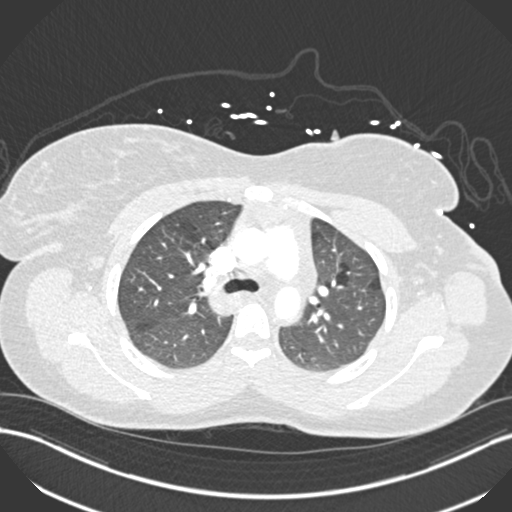
[im 168/252  mediastinal]
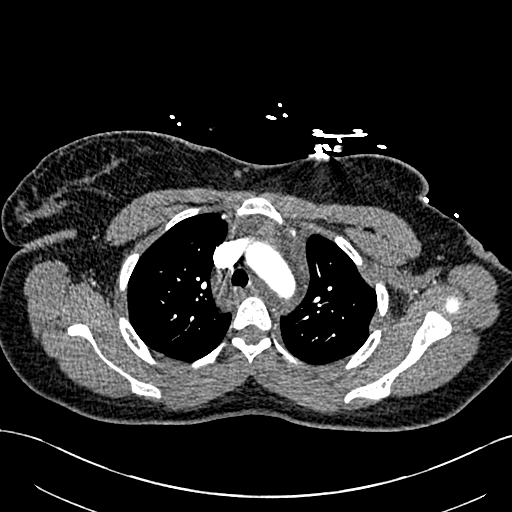
[im 185/252  lung]
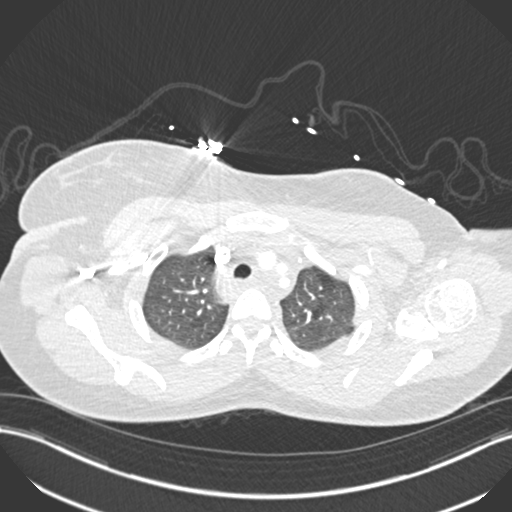
[im 201/252  mediastinal]
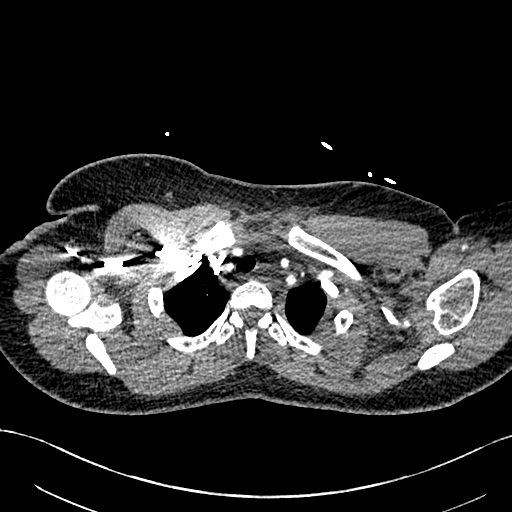
[im 218/252  lung]
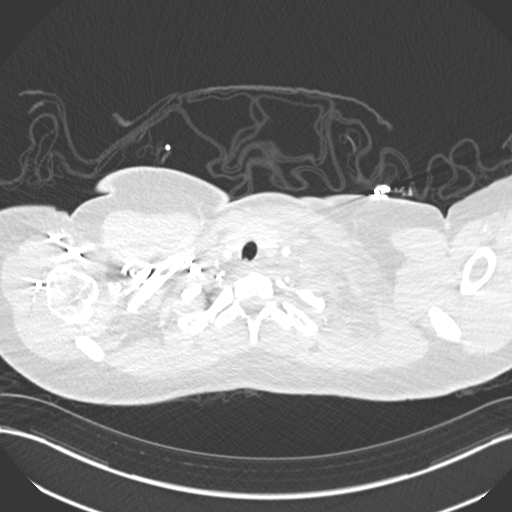
[im 235/252  mediastinal]
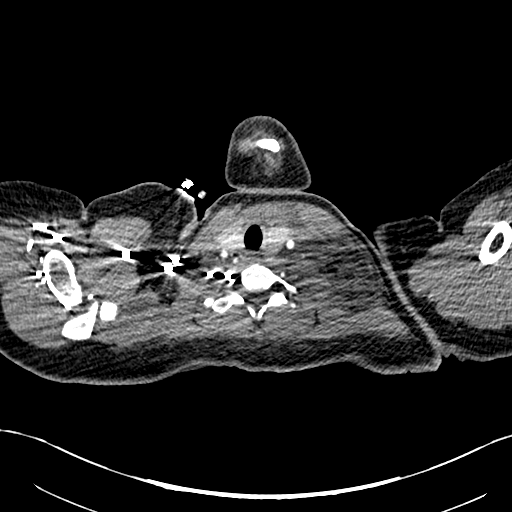

[Series 9: pe lung · axial · 0.98mm/px · z∈[-301,-226]mm · 2 of 75 slices shown]
[im 25/75  mediastinal]
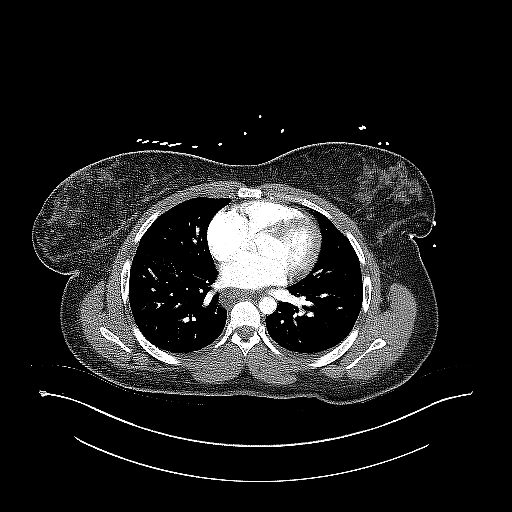
[im 50/75  mediastinal]
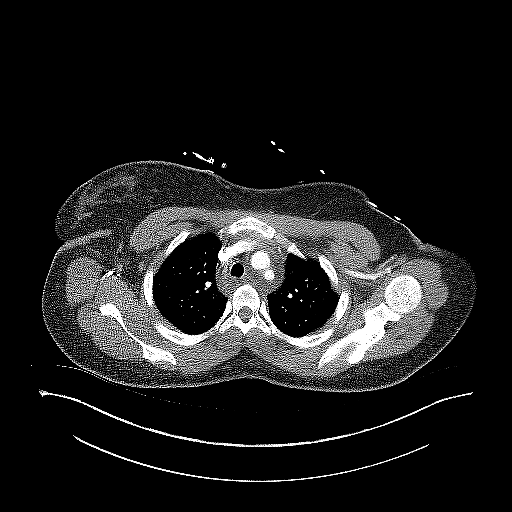

[Series 10: pe coronal mpr · coronal · 0.52mm/px · 1 of 129 slices shown]
[im 65/129  mediastinal]
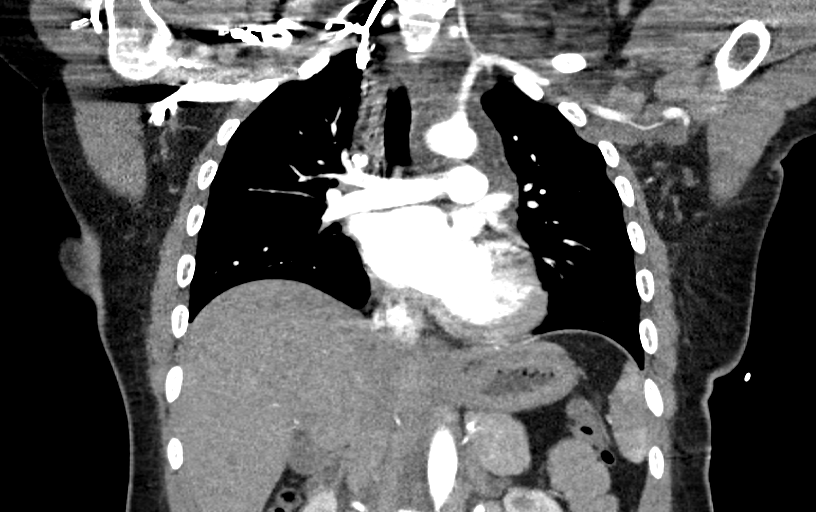

[17 of 36 positions shown; findings below may reference images not displayed]

FINDINGS: Cardiovascular: There is no pulmonary embolus. The aorta is normal.
The heart size is normal. There is no pericardial effusion. The
study is imaged in the arterial phase therefore there is limited
assessment of the venous systems.

Mediastinum/Nodes: There is generalized swelling edema of the left
neck extend in to the mediastinum. The trachea and esophagus are
normal. No definite mediastinal or hilar lymphadenopathy is
identified.

Lungs/Pleura: Minimal bilateral pleural effusions are identified.
Mild atelectasis of lung bases are noted. Minimal patchy hazy
ground-glass opacities identified in the bilateral lungs.

Upper Abdomen: No acute abnormality.

Musculoskeletal: No chest wall abnormality. No acute or significant
osseous findings.

Review of the MIP images confirms the above findings.
IMPRESSION: 1. No pulmonary embolus.
2. Generalized swelling edema of the left neck extend in to the
mediastinum. This study is image in the arterial phase therefore
there is limited assessment of the venous systems of the neck and
mediastinum. Recommend further evaluation with ultrasound of the
upper extremity and neck to out venous thrombosis.
3. Minimal bilateral pleural effusions with mild atelectasis of lung
bases.
4. Minimal patchy hazy ground-glass opacities identified in the
bilateral lungs. This is nonspecific but can be seen in atypical
infection.

These results will be called to the ordering clinician or
representative by the Radiologist Assistant, and communication
documented in the PACS or [REDACTED].

## 2022-01-17 IMAGING — CT CT NECK W/ CM
3 of 4 series · 9 of 33 positions shown, 11 images · IV contrast (Omnipaque)
Comparison: CTA chest today reported separately.
COMPARISON: CTA chest today reported separately.

Addendum:
CLINICAL DATA: 23-year-old female with left neck swelling since
waking today. Query Lemierre syndrome. No known injury. History of
time.

EXAM:
CT NECK WITH CONTRAST
TECHNIQUE: Multidetector CT imaging of the neck was performed using the
standard protocol following the bolus administration of intravenous
contrast.
CONTRAST:  100mL OMNIPAQUE IOHEXOL 350 MG/ML SOLN

[Series 3: axial neck · axial · 0.59mm/px · z∈[-142,-142]mm · 1 of 116 slices shown, 2 images]
[im 58/116  soft-tissue]
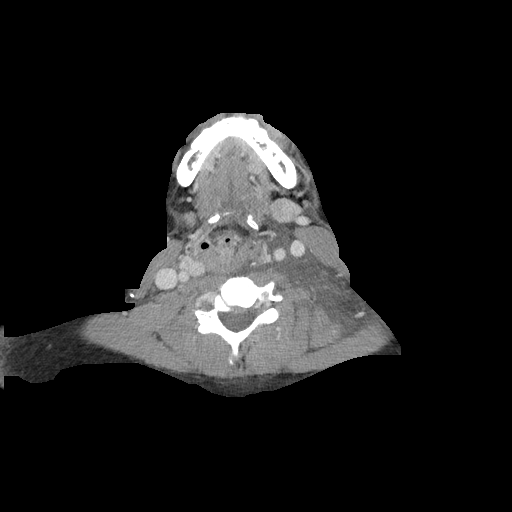
[im 58/116  bone]
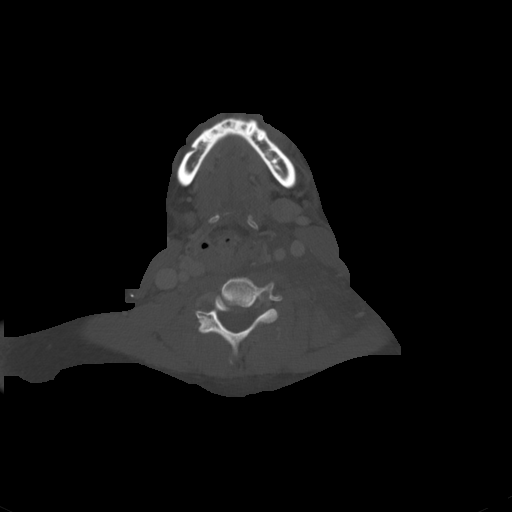

[Series 6: sag neck · sagittal · 0.43mm/px · 5 of 157 slices shown, 6 images]
[im 53/157  bone]
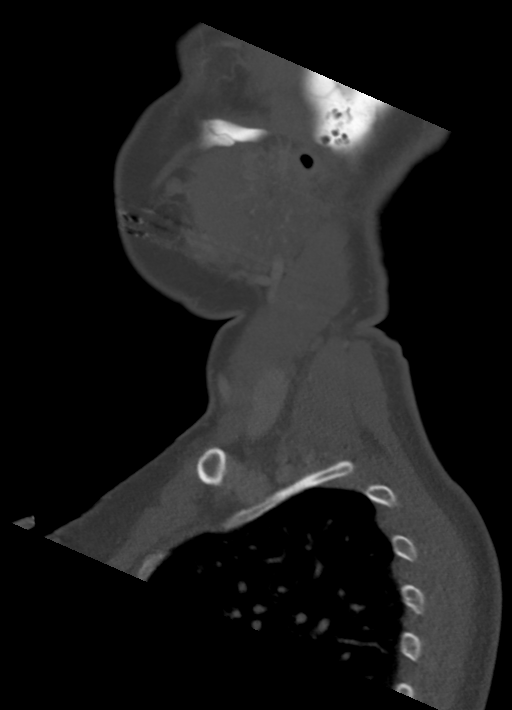
[im 66/157  bone]
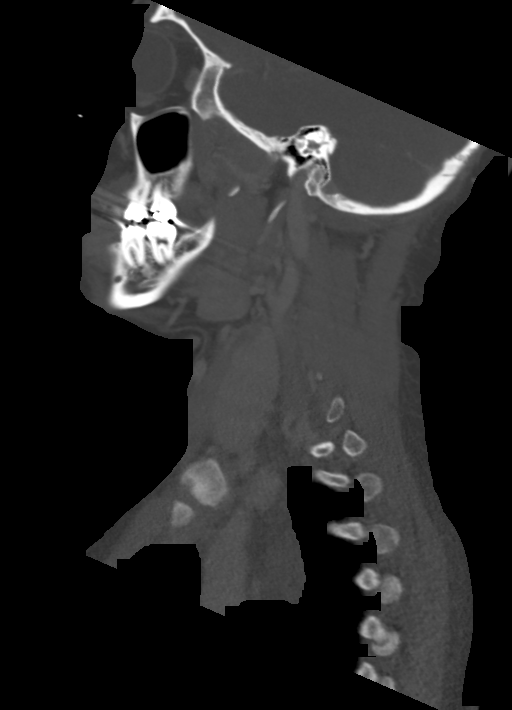
[im 79/157  soft-tissue]
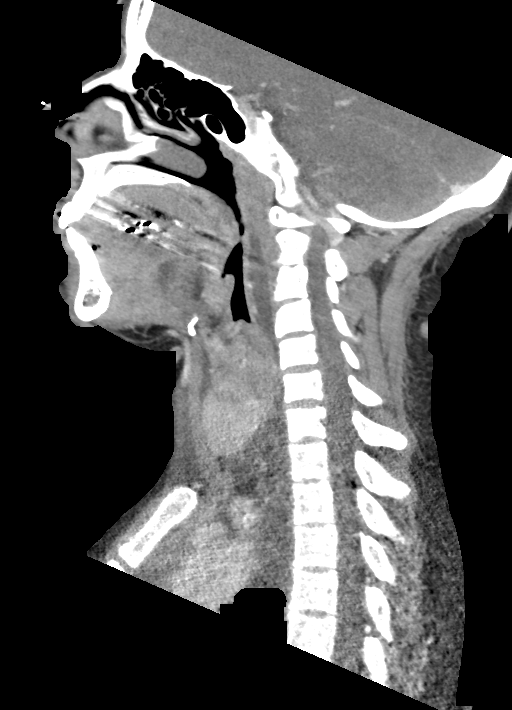
[im 79/157  bone]
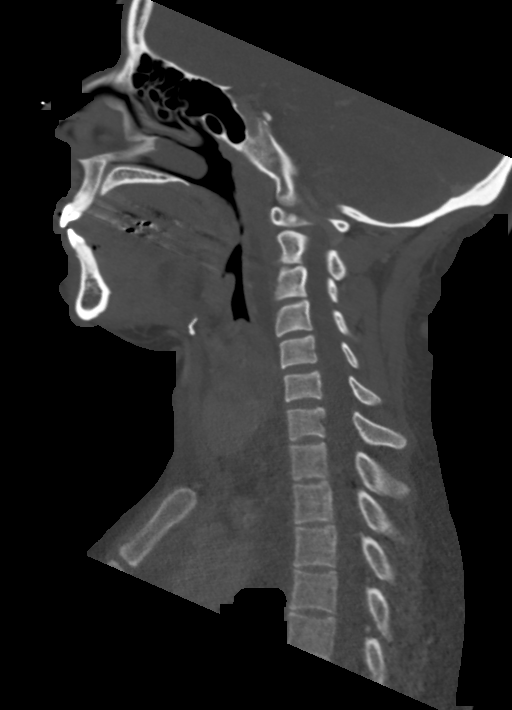
[im 92/157  bone]
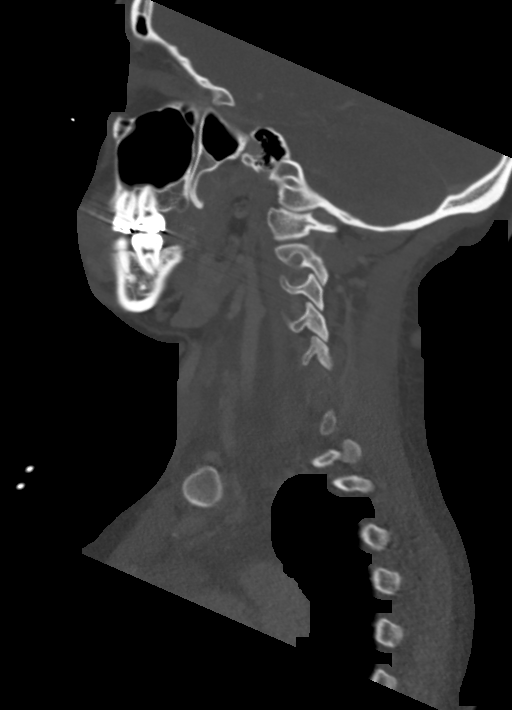
[im 105/157  bone]
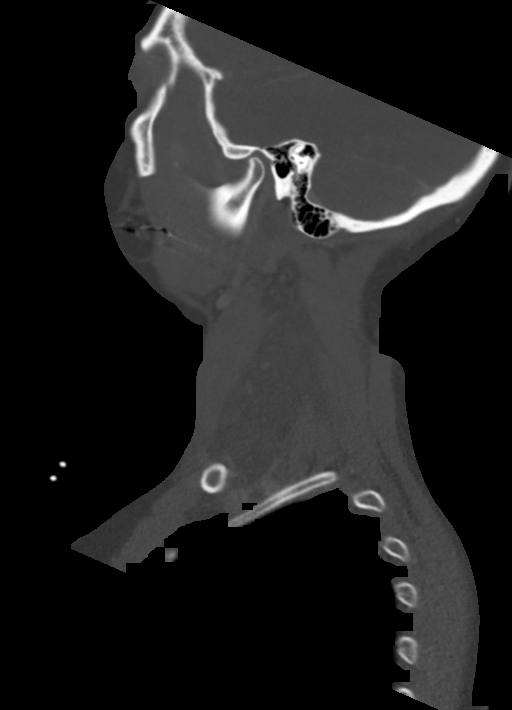

[Series 7: cor neck · coronal · 0.47mm/px · 3 of 81 slices shown]
[im 26/81  bone]
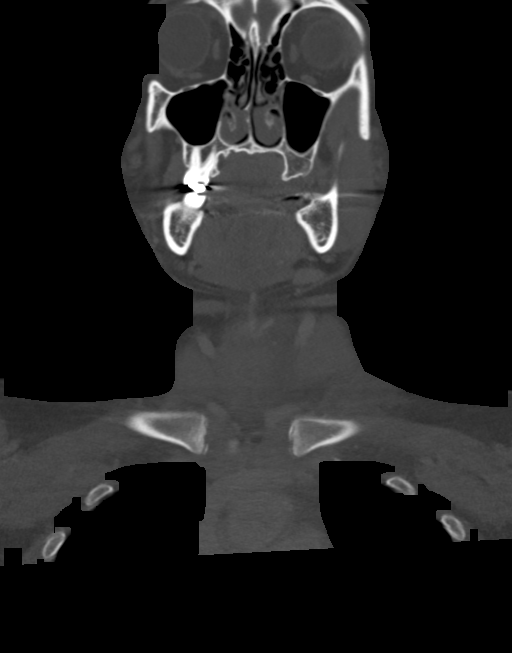
[im 36/81  bone]
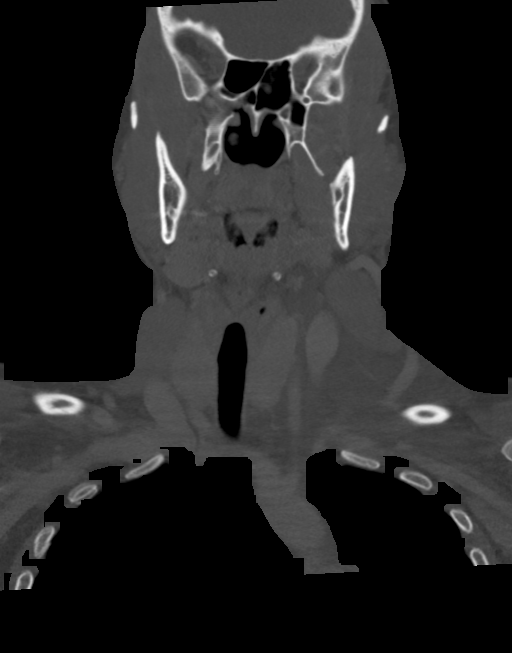
[im 45/81  bone]
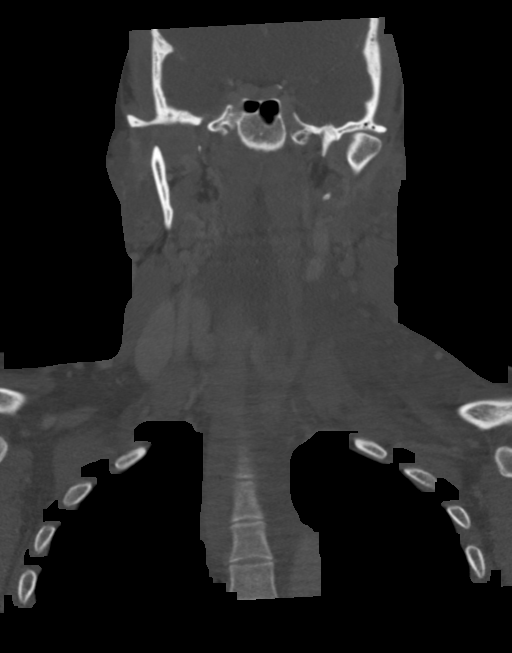

[9 of 33 positions shown; findings below may reference images not displayed]

FINDINGS: Pharynx and larynx: Laryngeal and pharyngeal soft tissue contours
remain within normal limits, with no tonsillar or mucosal space
hyperenhancement.

However, there is conspicuous retropharyngeal fluid or edema without
rim enhancement. In the neck this is most pronounced from the C2
through C4 cervical levels (sagittal image 82) although is
contiguous with abundant left parapharyngeal, carotid and other deep
soft tissue space edema (series 3, image 68).

However, the superior parapharyngeal space on the left remains
normal. The right parapharyngeal and deep soft tissue spaces are
normal.

Salivary glands: Sublingual, submandibular and parotid spaces are
spared and appear normal.

Thyroid: Thyroid size and enhancement appears within normal limits.
There are small enhancing thyroidal veins communicating with the
left lower IJ, no discrete thyroid lesion.

Lymph nodes: Despite the widespread deep space and trans spatial
edema in the left neck (series 3, image 57) there is no associated
lymphadenopathy. No enlarged or heterogeneous lymph nodes are
identified.

Vascular: The left IJ is patent and appears normal throughout its
course. The left CCA, left ICA and external carotid also appear
normal despite the soft tissue edema or fluid surrounding most of
the left carotid space in the neck.

Contralateral right neck and other major vascular structures appear
patent and normal to the skull base.

Limited intracranial: Negative.

Visualized orbits: Negative.

Mastoids and visualized paranasal sinuses: Clear aside from trace
bubbly opacity in the right sphenoid sinus. Tympanic cavities and
mastoids are clear.

Skeleton: No dental abnormality identified. Normal CT appearance of
the cervical spine. There are no dystrophic calcifications of the
longus coli muscles. No osseous abnormality identified.

Upper chest: Dedicated chest CTA is reported separately, but note
that it appears the widespread deep space soft tissue edema in the
left neck also continues into the mediastinum on series 3, image 92.
There is mild prevascular space mass effect, but no narrowing of the
trachea. Unremarkable visible esophagus.
IMPRESSION: Extensive, though nonspecific soft tissue edema and/or fluid
tracking throughout the deep spaces of the left lower neck and into
the visible mediastinum.

There is retropharyngeal space involvement also, although most
pronounced in the upper cervical levels (C2 through C3).

Negative for Lemierre syndrome, with patent and normal left IJ and
left carotid despite edema involving the left carotid space.

None of the soft tissue fluid is rim enhancing to suggest abscess.
And there is no clear source of this inflammation (e.g. no evidence
of tonsillitis, no left thyroid region which might have ruptured,
etc).
In the lower neck the epicenter of the edema is more supraclavicular
than central.

Perhaps this is an unusual presentation of Angioedema, such as due
to an allergic reaction.

If there is neck pain and fever then a Cervical Spine MRI (without
and with contrast) would be complementary to exclude an underlying
discitis .

Also recommend correlation with Chest CTA findings reported
separately.

ADDENDUM:
Study discussed by telephone with Dr. TALHA ENES SIGORTA on 07/10/2019 at
7200 hours.

My colleague Dr. Encoma on her Chest CTA read raised the interesting
possibility of a left upper extremity venous thrombosis, but these
neck CT images confirm patency of the left IJ, the left subclavian
vein, visible left axillary vein, and other central veins.

And after further discussion with Dr. FERNANDINA regarding patient
presentation and all clinical features the etiology of the extensive
soft tissue edema is unclear. There is no white count or fever. And
reportedly the patient takes no medication which could have led to a
reaction or toxicity. Still, the entire constellation seems most
compatible with a spontaneous angioedema although the location and
extent are very unusual.

I am happy to discuss further with ENT also should they have any
additional ideas.

*** End of Addendum ***
FINDINGS: Pharynx and larynx: Laryngeal and pharyngeal soft tissue contours
remain within normal limits, with no tonsillar or mucosal space
hyperenhancement.

However, there is conspicuous retropharyngeal fluid or edema without
rim enhancement. In the neck this is most pronounced from the C2
through C4 cervical levels (sagittal image 82) although is
contiguous with abundant left parapharyngeal, carotid and other deep
soft tissue space edema (series 3, image 68).

However, the superior parapharyngeal space on the left remains
normal. The right parapharyngeal and deep soft tissue spaces are
normal.

Salivary glands: Sublingual, submandibular and parotid spaces are
spared and appear normal.

Thyroid: Thyroid size and enhancement appears within normal limits.
There are small enhancing thyroidal veins communicating with the
left lower IJ, no discrete thyroid lesion.

Lymph nodes: Despite the widespread deep space and trans spatial
edema in the left neck (series 3, image 57) there is no associated
lymphadenopathy. No enlarged or heterogeneous lymph nodes are
identified.

Vascular: The left IJ is patent and appears normal throughout its
course. The left CCA, left ICA and external carotid also appear
normal despite the soft tissue edema or fluid surrounding most of
the left carotid space in the neck.

Contralateral right neck and other major vascular structures appear
patent and normal to the skull base.

Limited intracranial: Negative.

Visualized orbits: Negative.

Mastoids and visualized paranasal sinuses: Clear aside from trace
bubbly opacity in the right sphenoid sinus. Tympanic cavities and
mastoids are clear.

Skeleton: No dental abnormality identified. Normal CT appearance of
the cervical spine. There are no dystrophic calcifications of the
longus coli muscles. No osseous abnormality identified.

Upper chest: Dedicated chest CTA is reported separately, but note
that it appears the widespread deep space soft tissue edema in the
left neck also continues into the mediastinum on series 3, image 92.
There is mild prevascular space mass effect, but no narrowing of the
trachea. Unremarkable visible esophagus.
IMPRESSION: Extensive, though nonspecific soft tissue edema and/or fluid
tracking throughout the deep spaces of the left lower neck and into
the visible mediastinum.

There is retropharyngeal space involvement also, although most
pronounced in the upper cervical levels (C2 through C3).

Negative for Lemierre syndrome, with patent and normal left IJ and
left carotid despite edema involving the left carotid space.

None of the soft tissue fluid is rim enhancing to suggest abscess.
And there is no clear source of this inflammation (e.g. no evidence
of tonsillitis, no left thyroid region which might have ruptured,
etc).
In the lower neck the epicenter of the edema is more supraclavicular
than central.

Perhaps this is an unusual presentation of Angioedema, such as due
to an allergic reaction.

If there is neck pain and fever then a Cervical Spine MRI (without
and with contrast) would be complementary to exclude an underlying
discitis .

Also recommend correlation with Chest CTA findings reported
separately.

## 2022-02-22 ENCOUNTER — Telehealth: Payer: Self-pay

## 2022-02-22 NOTE — Telephone Encounter (Signed)
Mychart msg sent. AS, CMA

## 2022-04-18 IMAGING — US US PELVIS COMPLETE TRANSABD/TRANSVAG W DUPLEX
1 series · 13 of 25 positions shown · non-contrast
Comparison: October 08, 2019.

CLINICAL DATA: Right lower quadrant abdominal pain.

EXAM:
TRANSABDOMINAL AND TRANSVAGINAL ULTRASOUND OF PELVIS
DOPPLER ULTRASOUND OF OVARIES
TECHNIQUE: Both transabdominal and transvaginal ultrasound examinations of the
pelvis were performed. Transabdominal technique was performed for
global imaging of the pelvis including uterus, ovaries, adnexal
regions, and pelvic cul-de-sac.
It was necessary to proceed with endovaginal exam following the
transabdominal exam to visualize the endometrium and ovaries. Color
and duplex Doppler ultrasound was utilized to evaluate blood flow to
the ovaries.

[Series 1: us pelvis complete transabd/transvag w duplex · 96 acquisitions, 13 frames shown]
[im 1/96]
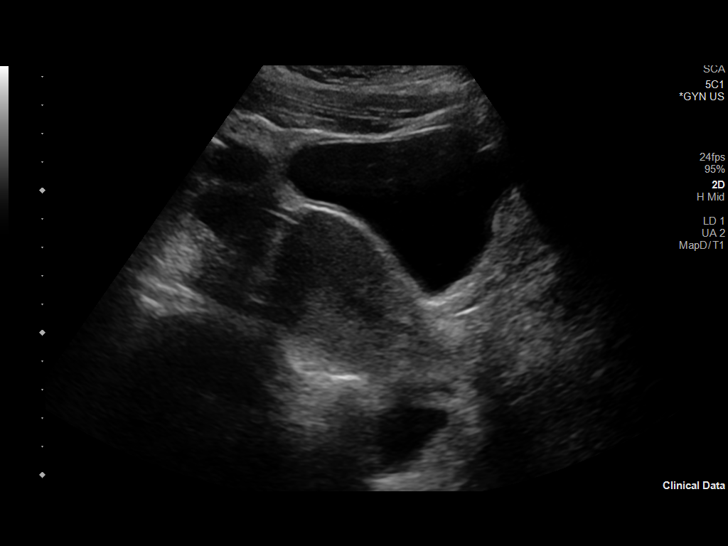
[im 8/96]
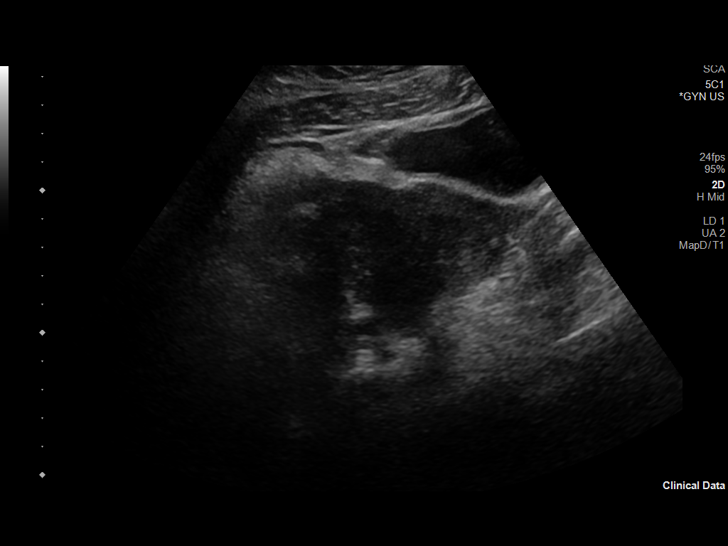
[im 16/96]
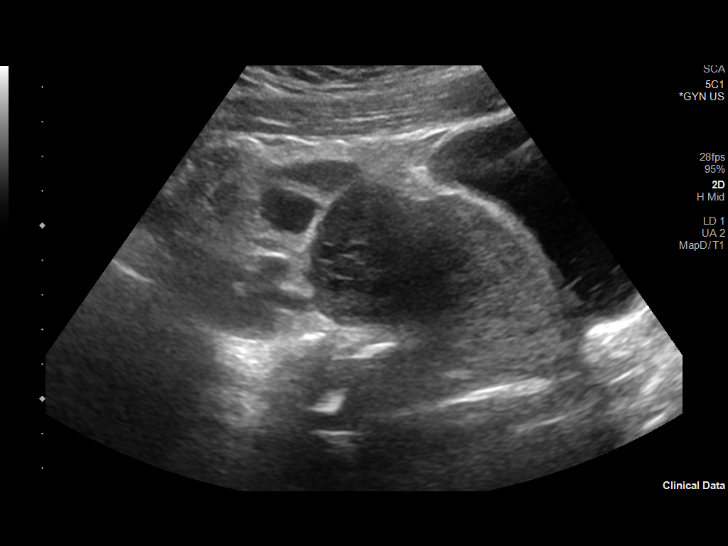
[im 24/96]
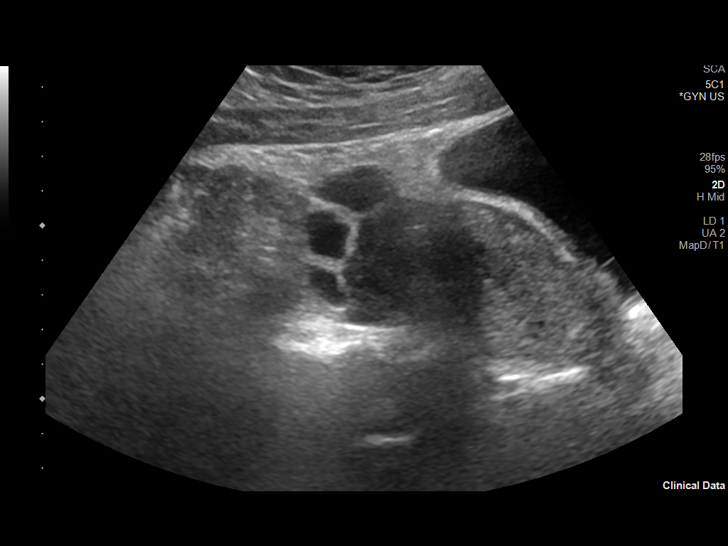
[im 32/96]
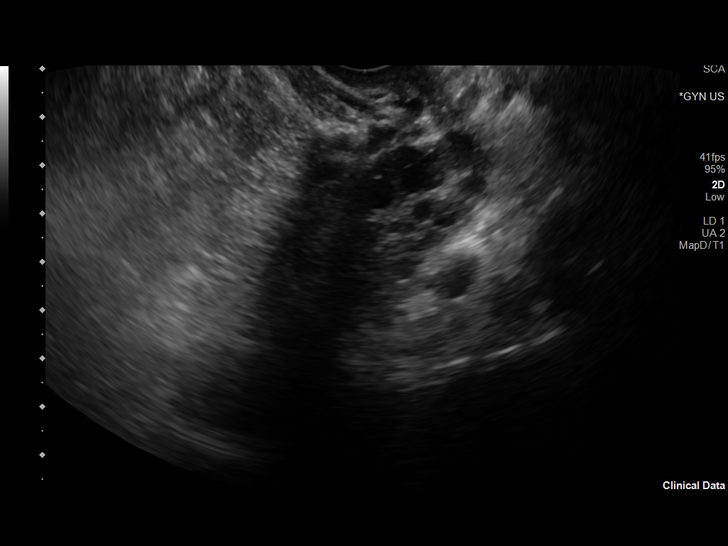
[im 40/96]
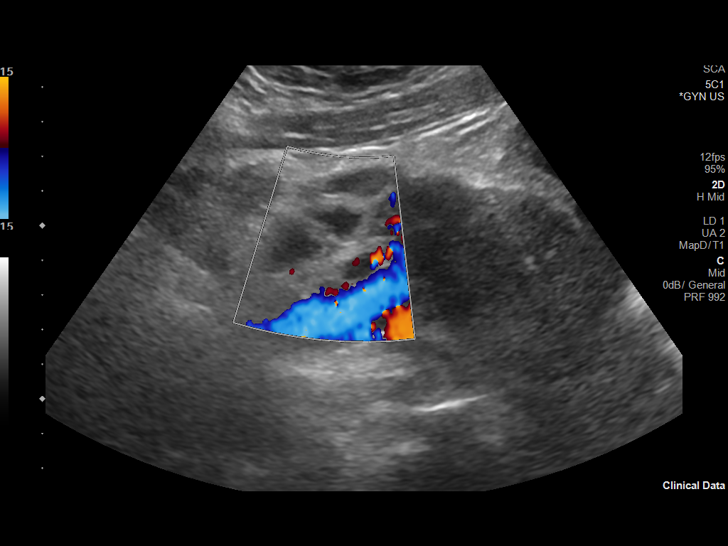
[im 48/96]
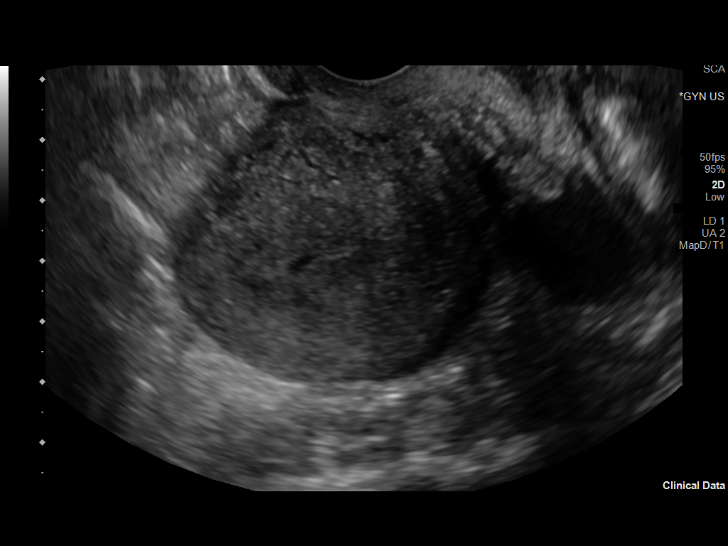
[im 56/96]
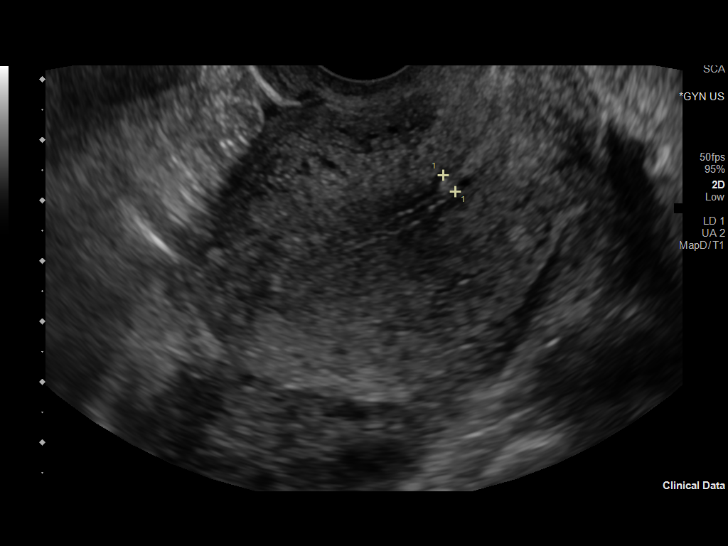
[im 64/96]
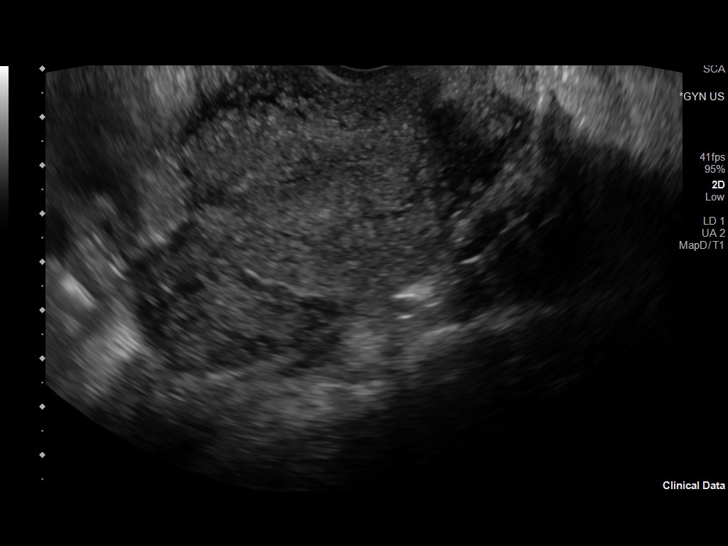
[im 72/96]
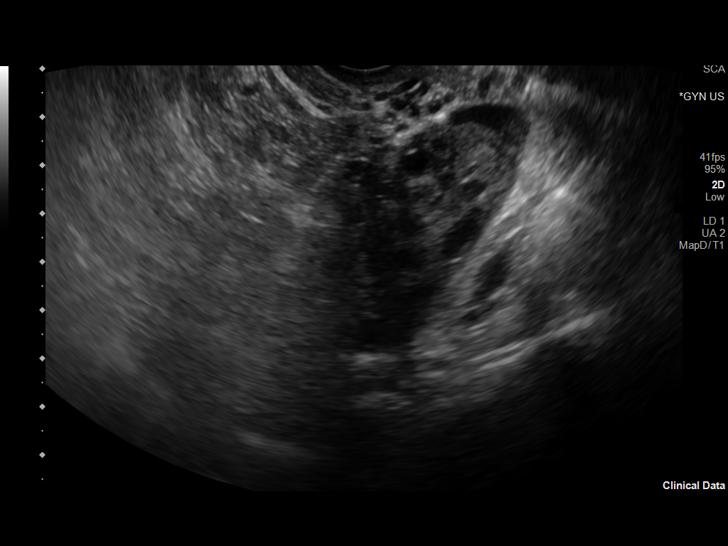
[im 80/96]
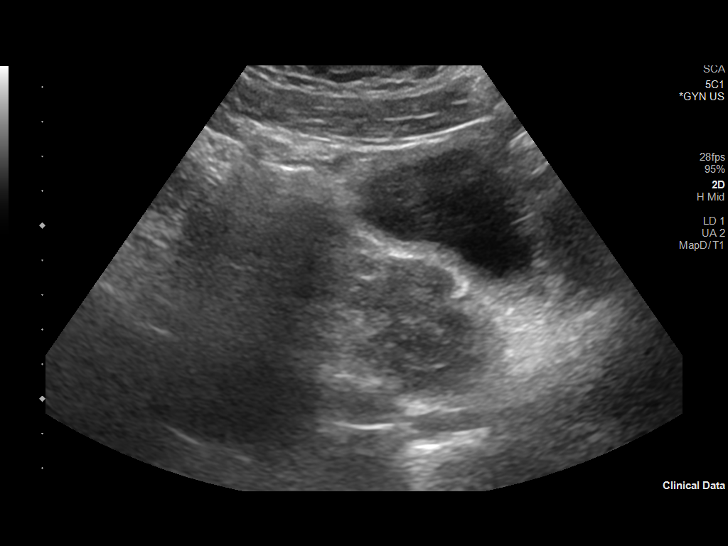
[im 88/96]
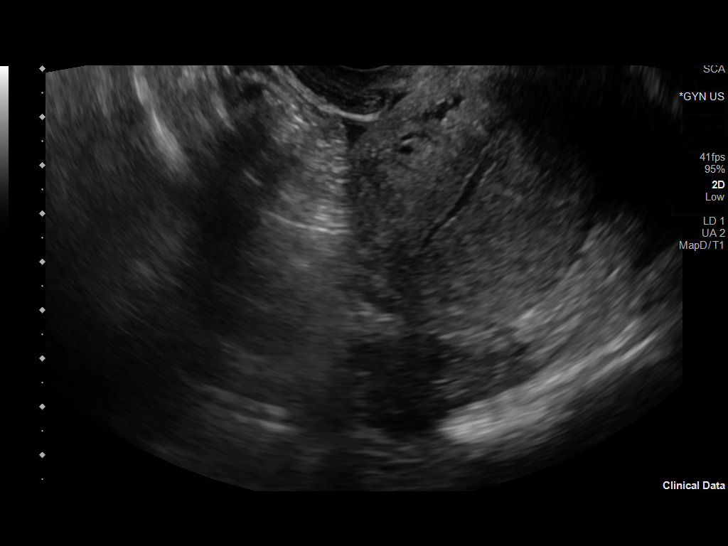
[im 96/96]
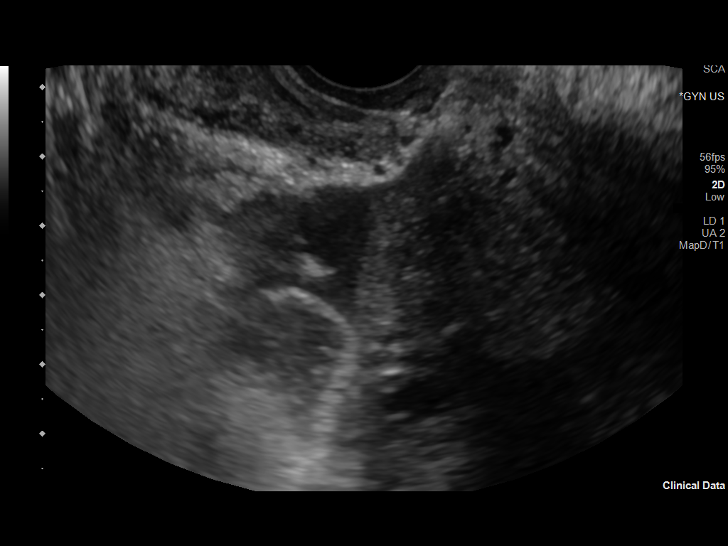

[13 of 25 positions shown; findings below may reference images not displayed]

FINDINGS: Uterus

Measurements: 7.7 x 6.4 x 5.4 cm = volume: 140 mL. No fibroids or
other mass visualized.

Endometrium

Thickness: 3 mm which is within normal limits. No focal abnormality
visualized.

Right ovary

Measurements: 4.9 x 5.0 x 2.4 cm = volume: 31 mL. Normal
appearance/no adnexal mass.

Left ovary

Measurements: 4.6 x 2.8 x 2.3 cm = volume: 15 mL. Normal
appearance/no adnexal mass.

Pulsed Doppler evaluation of both ovaries demonstrates normal
low-resistance arterial and venous waveforms.

Other findings

Moderate amount of free fluid is noted in the pelvis, most
prominently seen in the right adnexal region.
IMPRESSION: Moderate amount of free fluid is noted in the pelvis, most
prominently seen in the right adnexal region. No evidence of ovarian
torsion or mass. No other abnormality seen in the pelvis.

## 2023-08-19 DIAGNOSIS — N912 Amenorrhea, unspecified: Secondary | ICD-10-CM | POA: Diagnosis not present

## 2023-08-19 DIAGNOSIS — Z1330 Encounter for screening examination for mental health and behavioral disorders, unspecified: Secondary | ICD-10-CM | POA: Diagnosis not present

## 2023-08-19 DIAGNOSIS — Z3201 Encounter for pregnancy test, result positive: Secondary | ICD-10-CM | POA: Diagnosis not present

## 2023-08-29 NOTE — ED Triage Notes (Signed)
 Pt dx with COVID yesterday. Pt reports last night she started having neck and shoulder pain. Pt denies taking tylenol  d/t not having tylenol . Pt is approx [redacted] weeks pregnant. Pt denies having seen an OB yet

## 2023-09-02 ENCOUNTER — Emergency Department (HOSPITAL_BASED_OUTPATIENT_CLINIC_OR_DEPARTMENT_OTHER)
Admission: EM | Admit: 2023-09-02 | Discharge: 2023-09-02 | Disposition: A | Discharge status: Home / Self Care | Payer: PRIVATE HEALTH INSURANCE | Attending: Emergency Medicine | Admitting: Emergency Medicine

## 2023-09-02 ENCOUNTER — Encounter (HOSPITAL_BASED_OUTPATIENT_CLINIC_OR_DEPARTMENT_OTHER): Payer: Self-pay

## 2023-09-02 ENCOUNTER — Emergency Department (HOSPITAL_BASED_OUTPATIENT_CLINIC_OR_DEPARTMENT_OTHER): Payer: PRIVATE HEALTH INSURANCE

## 2023-09-02 ENCOUNTER — Other Ambulatory Visit: Payer: Self-pay

## 2023-09-02 DIAGNOSIS — Z8616 Personal history of COVID-19: Secondary | ICD-10-CM | POA: Insufficient documentation

## 2023-09-02 DIAGNOSIS — Z3A08 8 weeks gestation of pregnancy: Secondary | ICD-10-CM

## 2023-09-02 DIAGNOSIS — Z3A01 Less than 8 weeks gestation of pregnancy: Secondary | ICD-10-CM | POA: Diagnosis not present

## 2023-09-02 DIAGNOSIS — O26891 Other specified pregnancy related conditions, first trimester: Secondary | ICD-10-CM | POA: Diagnosis not present

## 2023-09-02 DIAGNOSIS — M542 Cervicalgia: Secondary | ICD-10-CM | POA: Insufficient documentation

## 2023-09-02 DIAGNOSIS — U071 COVID-19: Secondary | ICD-10-CM

## 2023-09-02 LAB — CBC WITH DIFFERENTIAL/PLATELET
Abs Immature Granulocytes: 0.02 K/uL (ref 0.00–0.07)
Basophils Absolute: 0 K/uL (ref 0.0–0.1)
Basophils Relative: 0 %
Eosinophils Absolute: 0.1 K/uL (ref 0.0–0.5)
Eosinophils Relative: 1 %
HCT: 30.5 % — ABNORMAL LOW (ref 36.0–46.0)
Hemoglobin: 8.8 g/dL — ABNORMAL LOW (ref 12.0–15.0)
Immature Granulocytes: 0 %
Lymphocytes Relative: 27 %
Lymphs Abs: 2.7 K/uL (ref 0.7–4.0)
MCH: 19.5 pg — ABNORMAL LOW (ref 26.0–34.0)
MCHC: 28.9 g/dL — ABNORMAL LOW (ref 30.0–36.0)
MCV: 67.5 fL — ABNORMAL LOW (ref 80.0–100.0)
Monocytes Absolute: 1.4 K/uL — ABNORMAL HIGH (ref 0.1–1.0)
Monocytes Relative: 14 %
Neutro Abs: 5.5 K/uL (ref 1.7–7.7)
Neutrophils Relative %: 58 %
Platelets: 357 K/uL (ref 150–400)
RBC: 4.52 MIL/uL (ref 3.87–5.11)
RDW: 19.8 % — ABNORMAL HIGH (ref 11.5–15.5)
WBC: 9.8 K/uL (ref 4.0–10.5)
nRBC: 0 % (ref 0.0–0.2)

## 2023-09-02 LAB — COMPREHENSIVE METABOLIC PANEL WITH GFR
ALT: 8 U/L (ref 0–44)
AST: 13 U/L — ABNORMAL LOW (ref 15–41)
Albumin: 4.2 g/dL (ref 3.5–5.0)
Alkaline Phosphatase: 51 U/L (ref 38–126)
Anion gap: 14 (ref 5–15)
BUN: 6 mg/dL (ref 6–20)
CO2: 18 mmol/L — ABNORMAL LOW (ref 22–32)
Calcium: 10 mg/dL (ref 8.9–10.3)
Chloride: 101 mmol/L (ref 98–111)
Creatinine, Ser: 0.55 mg/dL (ref 0.44–1.00)
GFR, Estimated: >60 mL/min (ref >60)
Glucose, Bld: 122 mg/dL — ABNORMAL HIGH (ref 70–99)
Potassium: 4 mmol/L (ref 3.5–5.1)
Sodium: 133 mmol/L — ABNORMAL LOW (ref 135–145)
Total Bilirubin: 0.3 mg/dL (ref 0.0–1.2)
Total Protein: 8.2 g/dL — ABNORMAL HIGH (ref 6.5–8.1)

## 2023-09-02 LAB — HCG, SERUM, QUALITATIVE: Preg, Serum: POSITIVE — AB

## 2023-09-02 MED ORDER — CYCLOBENZAPRINE HCL 10 MG PO TABS
10.0000 mg | ORAL_TABLET | Freq: Once | ORAL | Status: AC
  Administered 2023-09-02: 10 mg via ORAL
  Filled 2023-09-02: qty 1

## 2023-09-02 MED ORDER — LACTATED RINGERS IV BOLUS
1000.0000 mL | Freq: Once | INTRAVENOUS | Status: AC
  Administered 2023-09-02: 1000 mL via INTRAVENOUS

## 2023-09-02 MED ORDER — ACETAMINOPHEN 325 MG PO TABS
650.0000 mg | ORAL_TABLET | Freq: Once | ORAL | Status: AC
  Administered 2023-09-02: 650 mg via ORAL
  Filled 2023-09-02: qty 2

## 2023-09-02 MED ORDER — FENTANYL CITRATE PF 50 MCG/ML IJ SOSY
50.0000 ug | PREFILLED_SYRINGE | Freq: Once | INTRAMUSCULAR | Status: AC
  Administered 2023-09-02: 50 ug via INTRAVENOUS
  Filled 2023-09-02: qty 1

## 2023-09-02 NOTE — ED Provider Notes (Signed)
 [place] EMERGENCY DEPARTMENT AT [facility] Provider Note   CSN: [id number] Arrival date & time: 09/02/23  0247     Patient presents with: Torticollis   Judith Robinson is a 28 y.o. female.   Patient with left-sided neck pain for the past 4 days.  Denies any fall or injury.  States she has had this pain ever since she was diagnosed with COVID on August 2 first.  She has had 4 to 5 days of body aches, chills, congestion, cough.  Has not checked her temperature.  Denies headache.  While she was lying in bed tonight she turned and felt the neck pain got worse but denies any fall or injury.  No radiation of the pain down her arm.  No weakness or numbness or tingling.  No chest pain or trouble breathing.  No significant headache.  No photophobia or phonophobia.  She is approximately [redacted] weeks pregnant has not seen OB yet.  Denies any vaginal bleeding or discharge but does have some low back pain.  She is not planning  to keep this pregnancy.  The history is provided by the patient.       Prior to Admission medications   Medication Sig Start Date End Date Taking? Authorizing Provider  albuterol  (VENTOLIN  HFA) 108 (90 Base) MCG/ACT inhaler Inhale 2 puffs into the lungs every 6 (six) hours as needed for wheezing or shortness of breath. 07/11/19   Jillian Buttery, MD  cyclobenzaprine  (FLEXERIL ) 5 MG tablet Take 1 tablet (5 mg total) by mouth 3 (three) times daily as needed for muscle spasms. 01/30/20   Patt Alm Macho, MD  ferrous sulfate  325 (65 FE) MG tablet Take 1 tablet (325 mg total) by mouth daily. 07/11/19 07/10/20  Adhikari, Amrit, MD  loperamide  (IMODIUM ) 2 MG capsule Take 1 capsule (2 mg total) by mouth 4 (four) times daily as needed for diarrhea or loose stools. 10/08/19   Towana Ozell BROCKS, MD  ondansetron  (ZOFRAN  ODT) 4 MG disintegrating tablet Take 1 tablet (4 mg total) by mouth every 8 (eight) hours as needed for nausea or vomiting. 10/08/19   Towana Ozell BROCKS, MD  predniSONE   (DELTASONE ) 10 MG tablet Take 1 tablet (10 mg total) by mouth daily. Take 60 mg tomorrow followed by 50 mg for 1 day then 40 mg for 1 day then 30 mg for 1 day then 20 mg for 1 day then 10 mg for 1  day then stop 07/11/19   Jillian Buttery, MD    Allergies: Patient has no known allergies.    Review of Systems  Constitutional:  Positive for fatigue. Negative for activity change, appetite change and fever.  HENT:  Positive for congestion.   Respiratory:  Positive for cough.   Cardiovascular:  Negative for chest pain.  Gastrointestinal:  Negative for abdominal pain, nausea and vomiting.  Genitourinary:  Negative for dysuria and hematuria.  Musculoskeletal:  Positive for arthralgias, myalgias and neck pain.  Neurological:  Negative for dizziness, weakness and headaches.   all other systems are negative except as noted in the HPI and PMH.    Updated Vital Signs BP (!) 162/94   Pulse (!) 101   Temp 98.7 F (37.1 C) (Oral)   Resp 20   SpO2 99%   Physical Exam Vitals and nursing note reviewed.  Constitutional:      General: She is not in acute distress.    Appearance: She is well-developed.     Comments: Appears well, no distress  HENT:  Head: Normocephalic and atraumatic.     Mouth/Throat:     Pharynx: No oropharyngeal exudate.  Eyes:     Conjunctiva/sclera: Conjunctivae normal.     Pupils: Pupils are equal, round, and reactive to light.  Neck:     Comments: No meningismus. Left paraspinal cervical tenderness and trapezius tenderness with spasm.  Full range of motion of head from side-to-side.  Able to touch chin to chest. Cardiovascular:     Rate and Rhythm: Normal rate and regular rhythm.     Heart sounds: Normal heart sounds. No murmur heard. Pulmonary:     Effort: Pulmonary effort is normal. No respiratory distress.     Breath sounds: Normal breath sounds.  Chest:     Chest wall: No tenderness.  Abdominal:     Palpations: Abdomen is soft.     Tenderness: There is no  abdominal tenderness. There is no guarding or rebound.  Musculoskeletal:        General: No tenderness. Normal range of motion.     Cervical back: Normal range of motion and neck supple.  Skin:    General: Skin is warm.  Neurological:     Mental Status: She is alert and oriented to person, place, and time.     Cranial Nerves: No cranial nerve deficit.     Motor: No abnormal muscle tone.     Coordination: Coordination normal.     Comments:  5/5 strength throughout. CN 2-12 intact.Equal grip strength.   Psychiatric:        Behavior: Behavior normal.     (all labs ordered are listed, but only abnormal results are displayed) Labs Reviewed  CBC WITH DIFFERENTIAL/PLATELET - Abnormal; Notable for the following components:      Result Value   Hemoglobin 8.8 (*)    HCT 30.5 (*)    MCV 67.5 (*)    MCH 19.5 (*)    MCHC 28.9 (*)    RDW 19.8 (*)    Monocytes Absolute 1.4 (*)    All other components within normal limits  HCG, SERUM, QUALITATIVE - Abnormal; Notable for the following components:   Preg, Serum POSITIVE (*)    All other components within normal limits  COMPREHENSIVE METABOLIC PANEL WITH GFR - Abnormal; Notable for the following components:   Sodium 133 (*)    CO2 18 (*)    Glucose, Bld 122 (*)    Total Protein 8.2 (*)    AST 13 (*)    All other components within normal limits    EKG: None  Radiology: US  OB Comp Less 14 Wks Result Date: 09/02/2023 CLINICAL DATA:  Back pain, pregnant EXAM: OBSTETRIC <14 WK ULTRASOUND TECHNIQUE: Transabdominal ultrasound was performed for evaluation of the gestation as well as the maternal uterus and adnexal regions. COMPARISON:  None Available. FINDINGS: Intrauterine gestational sac: Single Yolk sac:  Visualized. Embryo:  Visualized. Cardiac Activity: Visualized. Heart Rate: 160 bpm CRL:   15.1 mm   7 w 6 d                  US  EDC: 04/14/2024 Subchorionic hemorrhage:  None visualized. Maternal uterus/adnexae: Small corpus luteum in the  right ovary. IMPRESSION: Single intrauterine gestation at sonographic gestational age [redacted] weeks, 6 days. Fetal heart rate 160 beats per minute. EDD 04/14/2024. Electronically Signed   By: Marolyn JONETTA Jaksch M.D.   On: 09/02/2023 07:03     Procedures   Medications Ordered in the ED  lactated ringers  bolus 1,000 mL (has  no administration in time range)  acetaminophen  (TYLENOL ) tablet 650 mg (has no administration in time range)  fentaNYL  (SUBLIMAZE ) injection 50 mcg (has no administration in time range)                                    Medical Decision Making Amount and/or Complexity of Data Reviewed Labs: ordered. Decision-making details documented in ED Course. Radiology: ordered and independent interpretation performed. Decision-making details documented in ED Course. ECG/medicine tests: ordered and independent interpretation performed. Decision-making details documented in ED Course.  Risk OTC drugs. Prescription drug management.   COVID-positive patient with left-sided neck pain for 4 days.  Intact strength.  No fever.  No meningismus.  She is [redacted] weeks pregnant.  Full range of motion of neck without meningismus.  Able to touch chin to chest and turn head side-to-side.  She is afebrile.  No significant headache.  No fever.  Doubt meningitis.  Labs with stable anemia.  Given her low back pain and positive pregnancy test will obtain ultrasound to rule out ectopic pregnancy.  Denies abdominal pain or vaginal bleeding.  Blood pressure has improved.  Discussed with patient given her pregnancy medication options are limited.  She understands any pain medication will affect her fetus.  She accepts these risks and states she does not plan to keep this pregnancy.  She has no meningismus with low suspicion for meningitis.  She is not febrile.  He is neurologically intact.  No significant headache.  Risks and benefits of lumbar puncture discussed with patient.  Low suspicion clinically for  meningitis as she has no fever or meningismus.  She has no headache.  Her pain is reproducible across her trapezius and upper shoulder. She declined lumbar puncture at this time.  Suspect musculoskeletal soreness in the setting of COVID illness.  Ultrasound pending at shift change to evaluate for intrauterine gestation.  ReturnTo be transferred at shift change pending reassessment and ultrasound.     Final diagnoses:  None    ED Discharge Orders     None          Taran Hable, Garnette, MD 09/02/23 [phone]

## 2023-09-02 NOTE — ED Provider Notes (Signed)
  Physical Exam  BP 130/70   Pulse 78   Temp 98.3 F (36.8 C) (Oral)   Resp 16   SpO2 100%   Physical Exam Vitals and nursing note reviewed.  HENT:     Head: Normocephalic and atraumatic.  Eyes:     Pupils: Pupils are equal, round, and reactive to light.  Cardiovascular:     Rate and Rhythm: Normal rate and regular rhythm.  Pulmonary:     Effort: Pulmonary effort is normal.     Breath sounds: Normal breath sounds.  Abdominal:     Palpations: Abdomen is soft.     Tenderness: There is no abdominal tenderness.  Skin:    General: Skin is warm and dry.  Neurological:     Mental Status: She is alert.  Psychiatric:        Mood and Affect: Mood normal.     Procedures  Procedures  ED Course / MDM   Clinical Course as of 09/02/23 0845  Tue Sep 02, 2023  0841 Patient reports improvement in her symptoms after medications here.  Less likely cause of neck discomfort would be musculoskeletal in origin due to acute COVID infection.  Instructed on symptomatic management.  Appropriate discharge [MP]    Clinical Course User Index [MP] Pamella Ozell LABOR, DO   Medical Decision Making I, Ozell Pamella DO, have assumed care of this patient from the previous provider pending reevaluation of neck discomfort after medications and disposition  Amount and/or Complexity of Data Reviewed Labs: ordered. Radiology: ordered.  Risk OTC drugs. Prescription drug management.          Pamella Ozell LABOR, DO 09/02/23 [phone]

## 2023-09-02 NOTE — Discharge Instructions (Signed)
 You declined lumbar puncture today. Keep yourself hydrated.  Use Tylenol  as needed for aches and fever.  Follow-up with your primary doctor as well as your OB doctor.  Return to the ED with chest pain, trouble breathing, worsening headache, neck pain, weakness, numbness or tingling.

## 2023-09-02 NOTE — ED Triage Notes (Signed)
 Pt presents via POV c/o left sided neck and back pain. Reports worse with movement. Reports dx with Covid on Thursday.   Breathing equal and unlabored at this time. A&O x4. Ambulatory to triage.

## 2023-09-02 NOTE — ED Notes (Signed)
 Pt discharged home and given discharge paperwork. Opportunities given for questions. Pt verbalizes understanding. PIV removed x1. Bethena Powell SAUNDERS , RN

## 2023-09-04 ENCOUNTER — Other Ambulatory Visit: Payer: Self-pay

## 2023-09-04 ENCOUNTER — Emergency Department (HOSPITAL_BASED_OUTPATIENT_CLINIC_OR_DEPARTMENT_OTHER)
Admission: EM | Admit: 2023-09-04 | Discharge: 2023-09-04 | Disposition: A | Discharge status: Home / Self Care | Payer: PRIVATE HEALTH INSURANCE | Attending: Emergency Medicine | Admitting: Emergency Medicine

## 2023-09-04 ENCOUNTER — Encounter (HOSPITAL_BASED_OUTPATIENT_CLINIC_OR_DEPARTMENT_OTHER): Payer: Self-pay

## 2023-09-04 DIAGNOSIS — M542 Cervicalgia: Secondary | ICD-10-CM | POA: Insufficient documentation

## 2023-09-04 DIAGNOSIS — Z3A01 Less than 8 weeks gestation of pregnancy: Secondary | ICD-10-CM | POA: Diagnosis not present

## 2023-09-04 DIAGNOSIS — J45909 Unspecified asthma, uncomplicated: Secondary | ICD-10-CM | POA: Diagnosis not present

## 2023-09-04 MED ORDER — OXYCODONE-ACETAMINOPHEN 5-325 MG PO TABS
1.0000 | ORAL_TABLET | Freq: Once | ORAL | Status: AC
  Administered 2023-09-04: 1 via ORAL
  Filled 2023-09-04: qty 1

## 2023-09-04 NOTE — ED Triage Notes (Signed)
 Pt reports she is here today due to upper back pain.Pt report she was seen here yesterday for same. Pt reports muscle relaxer are no longer working. Pt is covid & preg positive.

## 2023-09-04 NOTE — Discharge Instructions (Signed)
 You were seen today for ongoing upper back and neck pain.  This is very consistent with musculoskeletal pain.  Unfortunately because of your pregnancy, you are limited in treatment.  Use ice or heat.  Continue Tylenol  and Flexeril .

## 2023-09-04 NOTE — ED Provider Notes (Signed)
 [place] EMERGENCY DEPARTMENT AT [facility] Provider Note   CSN: [id number] Arrival date & time: 09/04/23  9677     Patient presents with: Back Pain   Elisabel Hanover is a 28 y.o. female.   HPI     This is a 28 year old female who presents with upper back and neck pain.  She has been seen previously for this.  She was recently diagnosed with COVID and is also currently pregnant.  She had a pregnancy confirmed during her last ED visit.  Patient states she has been taking Tylenol  and muscle relaxants with minimal relief of pain.  Pain is worse with range of motion of the neck.  Denies any weakness, numbness, tingling of the upper extremities.  Denies any fevers.  Also reports that heat and ice are not helping.  She has no pregnancy related complaints.  Chart reviewed.  Patient had basic lab work that was reassuring.  She subsequently also had ultrasound imaging that confirmed an IUP.  Prior to Admission medications   Medication Sig Start Date End Date Taking? Authorizing Provider  albuterol  (VENTOLIN  HFA) 108 (90 Base) MCG/ACT inhaler Inhale 2 puffs into the lungs every 6 (six) hours as needed for wheezing or shortness of breath. 07/11/19   Jillian Buttery, MD  cyclobenzaprine  (FLEXERIL ) 5 MG tablet Take 1 tablet (5 mg total) by mouth 3 (three) times daily as needed for muscle spasms. 01/30/20   Patt Alm Macho, MD  ferrous sulfate  325 (65 FE) MG tablet Take 1 tablet (325 mg total) by mouth daily. 07/11/19 07/10/20  Adhikari, Amrit, MD  loperamide  (IMODIUM ) 2 MG capsule Take 1 capsule (2 mg total) by mouth 4 (four) times daily as needed for diarrhea or loose stools. 10/08/19   Towana Ozell BROCKS, MD  ondansetron  (ZOFRAN  ODT) 4 MG disintegrating tablet Take 1 tablet (4 mg total) by mouth every 8 (eight) hours as needed for nausea or vomiting. 10/08/19   Towana Ozell BROCKS, MD  predniSONE  (DELTASONE ) 10 MG tablet Take 1 tablet (10 mg total) by mouth daily. Take 60 mg tomorrow followed by 50  mg for 1 day then 40 mg for 1 day then 30 mg for 1 day then 20 mg for 1 day then 10 mg for 1  day then stop 07/11/19   Jillian Buttery, MD    Allergies: Patient has no known allergies.    Review of Systems  Constitutional:  Negative for fever.  Musculoskeletal:  Positive for neck pain.  Neurological:  Negative for weakness and numbness.  All other systems reviewed and are negative.   Updated Vital Signs BP 130/87 (BP Location: Right Arm)   Pulse 80   Temp 98.3 F (36.8 C) (Oral)   Resp 18   SpO2 98%   Physical Exam Vitals and nursing note reviewed.  Constitutional:      Appearance: She is well-developed. She is not ill-appearing.  HENT:     Head: Normocephalic and atraumatic.     Mouth/Throat:     Mouth: Mucous membranes are moist.  Eyes:     Pupils: Pupils are equal, round, and reactive to light.  Neck:     Comments: Normal range of motion of the neck with flexion and extension and axial range of motion intact, tenderness to palpation over the bilateral paraspinous musculature of the cervical spine into the lower trapezius muscles Cardiovascular:     Rate and Rhythm: Normal rate and regular rhythm.     Heart sounds: Normal heart sounds.  Pulmonary:  Effort: Pulmonary effort is normal. No respiratory distress.     Breath sounds: No wheezing.  Abdominal:     Palpations: Abdomen is soft.  Musculoskeletal:     Cervical back: Neck supple.  Skin:    General: Skin is warm and dry.  Neurological:     Mental Status: She is alert and oriented to person, place, and time.     Comments: 5 out of 5 strength bilateral upper extremities     (all labs ordered are listed, but only abnormal results are displayed) Labs Reviewed - No data to display  EKG: None  Radiology: US  OB Comp Less 14 Wks Result Date: 09/02/2023 CLINICAL DATA:  Back pain, pregnant EXAM: OBSTETRIC <14 WK ULTRASOUND TECHNIQUE: Transabdominal ultrasound was performed for evaluation of the gestation as well  as the maternal uterus and adnexal regions. COMPARISON:  None Available. FINDINGS: Intrauterine gestational sac: Single Yolk sac:  Visualized. Embryo:  Visualized. Cardiac Activity: Visualized. Heart Rate: 160 bpm CRL:   15.1 mm   7 w 6 d                  US  EDC: 04/14/2024 Subchorionic hemorrhage:  None visualized. Maternal uterus/adnexae: Small corpus luteum in the right ovary. IMPRESSION: Single intrauterine gestation at sonographic gestational age [redacted] weeks, 6 days. Fetal heart rate 160 beats per minute. EDD 04/14/2024. Electronically Signed   By: Marolyn JONETTA Jaksch M.D.   On: 09/02/2023 07:03     Procedures   Medications Ordered in the ED  oxyCODONE -acetaminophen  (PERCOCET/ROXICET) 5-325 MG per tablet 1 tablet (1 tablet Oral Given 09/04/23 0404)                                    Medical Decision Making Risk Prescription drug management.   This patient presents to the ED for concern of neck pain, this involves an extensive number of treatment options, and is a complaint that carries with it a high risk of complications and morbidity.  I considered the following differential and admission for this acute, potentially life threatening condition.  The differential diagnosis includes spasm, myalgias related to recent viral illness, viral meningitis, less likely bacterial meningitis  MDM:    This is a 28 year old female who presents with persistent neck pain.  Nontoxic and vital signs are reassuring.  She has normal range of motion without evidence of meningismus.  Meningitis was considered on last presentation.  However, would highly favor a viral etiology if that were the case.  Her physical exam is much more suggestive of muscle spasm and musculoskeletal etiology.  She is limited because she is unable to take NSAIDs because of her current pregnancy.  She initially told me that she was not planning to keep the pregnancy.  However discussed with her that since she is currently pregnant and may change  her mind, I do not feel it is appropriate to provide NSAIDs.  She was given a Percocet and a Flexeril .  Do not feel imaging is indicated as I have low suspicion for fracture.  Recommend ongoing supportive measures.  (Labs, imaging, consults)  Labs: I Ordered, and personally interpreted labs.  The pertinent results include: N/A  Imaging Studies ordered: I ordered imaging studies including none I independently visualized and interpreted imaging. I agree with the radiologist interpretation  Additional history obtained from chart review.  External records from outside source obtained and reviewed including recent evaluation  Cardiac  Monitoring: The patient was not maintained on a cardiac monitor.  If on the cardiac monitor, I personally viewed and interpreted the cardiac monitored which showed an underlying rhythm of: N/A  Reevaluation: After the interventions noted above, I reevaluated the patient and found that they have :stayed the same  Social Determinants of Health:  lives independently  Disposition: Discharge  Co morbidities that complicate the patient evaluation  Past Medical History:  Diagnosis Date   Asthma    History of pulmonary embolism      Medicines Meds ordered this encounter  Medications   oxyCODONE -acetaminophen  (PERCOCET/ROXICET) 5-325 MG per tablet 1 tablet    Refill:  0    I have reviewed the patients home medicines and have made adjustments as needed  Problem List / ED Course: Problem List Items Addressed This Visit   None Visit Diagnoses       Neck pain, musculoskeletal    -  Primary                Final diagnoses:  Neck pain, musculoskeletal    ED Discharge Orders     None          Bari Charmaine FALCON, MD 09/04/23 [phone]

## 2023-09-19 ENCOUNTER — Other Ambulatory Visit: Payer: Self-pay | Admitting: Medical Genetics

## 2023-09-19 ENCOUNTER — Other Ambulatory Visit: Payer: Self-pay

## 2023-09-19 ENCOUNTER — Emergency Department (HOSPITAL_BASED_OUTPATIENT_CLINIC_OR_DEPARTMENT_OTHER)
Admission: EM | Admit: 2023-09-19 | Discharge: 2023-09-19 | Disposition: A | Discharge status: Home / Self Care | Payer: PRIVATE HEALTH INSURANCE | Source: Ambulatory Visit | Attending: Emergency Medicine | Admitting: Emergency Medicine

## 2023-09-19 DIAGNOSIS — Z0189 Encounter for other specified special examinations: Secondary | ICD-10-CM

## 2023-09-19 DIAGNOSIS — Z3201 Encounter for pregnancy test, result positive: Secondary | ICD-10-CM | POA: Diagnosis present

## 2023-09-19 LAB — HCG, QUANTITATIVE, PREGNANCY: hCG, Beta Chain, Quant, S: 77 m[IU]/mL — ABNORMAL HIGH (ref <5)

## 2023-09-19 NOTE — Discharge Instructions (Signed)
 Your hCG today was 61, you will need to follow-up with OB/GYN in order to obtain a repeat hCG level to trend this down to 0.  Because there was no bleeding, or cramping today we did not go with further evaluation.  Please follow-up with your OB/GYN on Tuesday for your scheduled appointment.

## 2023-09-19 NOTE — ED Notes (Signed)
 Pt LMP was 6/30.  Pt found out she was pregnant 8/2 or 8/3 by home pregnancy test and had confirmation by urine pregnancy test at [facility] after that.  Pt was seen on 8/30 due to cramping and bleeding and she thought she might be having a miscarriage.  Quant was done, pt was advised that she was pregnant (ultrasound and blood work was done).  Pt continues to have vaginal bleeding which she describes as spotting and barely there.  Pt called and spoke with her OBGYN and they advised for her to come get another quant to see how it is trending.  No distress noted.

## 2023-09-19 NOTE — ED Triage Notes (Signed)
 Pt caox4, ambulatory stating approx 2 wks ago [place] because she was having vaginal bleeding and thought she was having a miscarriage. Pt states she was told according to labs and US  she is still pregnant but went home, continued to bleed and passed large clot. Pt states she called OB/GYN who recommended repeat hcg lab.   [id number].

## 2023-09-19 NOTE — ED Provider Notes (Signed)
 [place] EMERGENCY DEPARTMENT AT [facility] Provider Note   CSN: [id number] Arrival date & time: 09/19/23  0900     Patient presents with: No chief complaint on file.   Judith Robinson is a 28 y.o. female.   28 y.o female G2P0A1 currently approximately [redacted] weeks pregnant presents to the ED with request of blood draw.  Patient reports she was evaluated on August 26 back in [place] , she was having multiple episodes of bleeding along with lower abdominal cramping. She had an ultrasound then which show an intrauterine pregnancy.  She tells me that she continued to bleed the following week and stated to have cramping.  This has since stopped, she does have an appointment with women's health in [place] to establish care on September 16, this upcoming Tuesday.  She tells me that after that occurred she was told by her OB to come to the ED for repeat of her hCG.  She is currently not having any vaginal bleeding, no abdominal cramping, no vomiting, no nausea or other complaints.  The history is provided by the patient and medical records.       Prior to Admission medications   Medication Sig Start Date End Date Taking? Authorizing Provider  albuterol  (VENTOLIN  HFA) 108 (90 Base) MCG/ACT inhaler Inhale 2 puffs into the lungs every 6 (six) hours as needed for wheezing or shortness of breath. 07/11/19   Jillian Buttery, MD  cyclobenzaprine  (FLEXERIL ) 5 MG tablet Take 1 tablet (5 mg total) by mouth 3 (three) times daily as needed for muscle spasms. 01/30/20   Patt Alm Macho, MD  ferrous sulfate  325 (65 FE) MG tablet Take 1 tablet (325 mg total) by mouth daily. 07/11/19 07/10/20  Jillian Buttery, MD  loperamide  (IMODIUM ) 2 MG capsule Take 1 capsule (2 mg total) by mouth 4 (four) times daily as needed for diarrhea or loose stools. 10/08/19   Towana Ozell BROCKS, MD  ondansetron  (ZOFRAN  ODT) 4 MG disintegrating tablet Take 1 tablet (4 mg total) by mouth every 8 (eight) hours as needed for nausea or  vomiting. 10/08/19   Towana Ozell BROCKS, MD  predniSONE  (DELTASONE ) 10 MG tablet Take 1 tablet (10 mg total) by mouth daily. Take 60 mg tomorrow followed by 50 mg for 1 day then 40 mg for 1 day then 30 mg for 1 day then 20 mg for 1 day then 10 mg for 1  day then stop 07/11/19   Jillian Buttery, MD    Allergies: Patient has no known allergies.    Review of Systems  Constitutional:  Negative for chills and fever.  Respiratory:  Negative for shortness of breath.   Cardiovascular:  Negative for chest pain.  Gastrointestinal:  Negative for abdominal pain, nausea and vomiting.  Genitourinary:  Negative for flank pain and vaginal bleeding.  All other systems reviewed and are negative.   Updated Vital Signs BP (!) 151/69 (BP Location: Right Arm)   Pulse 90   Temp 98 F (36.7 C)   Resp 18   LMP 07/07/2023 (Exact Date)   SpO2 100%   Physical Exam Vitals and nursing note reviewed.  Constitutional:      General: She is not in acute distress.    Appearance: She is well-developed.  HENT:     Head: Normocephalic and atraumatic.     Mouth/Throat:     Pharynx: No oropharyngeal exudate.  Eyes:     Pupils: Pupils are equal, round, and reactive to light.  Cardiovascular:  Rate and Rhythm: Regular rhythm.     Heart sounds: Normal heart sounds.  Pulmonary:     Effort: Pulmonary effort is normal. No respiratory distress.  Abdominal:     General: Bowel sounds are normal. There is no distension.     Palpations: Abdomen is soft.     Tenderness: There is no abdominal tenderness.  Musculoskeletal:        General: No tenderness or deformity.     Cervical back: Normal range of motion.     Right lower leg: No edema.     Left lower leg: No edema.  Skin:    General: Skin is warm and dry.  Neurological:     Mental Status: She is alert and oriented to person, place, and time.     (all labs ordered are listed, but only abnormal results are displayed) Labs Reviewed  HCG, QUANTITATIVE, PREGNANCY -  Abnormal; Notable for the following components:      Result Value   hCG, Beta Chain, Quant, S 77 (*)    All other components within normal limits    EKG: None  Radiology: No results found.   Procedures   Medications Ordered in the ED - No data to display  Clinical Course as of 09/19/23 1034  Fri Sep 19, 2023  1032 HCG, Beta Chain, Quant, S(!): 77 Likely miscarried after her 08/26 visit.  [JS]    Clinical Course User Index [JS] Maryjane Benedict, PA-C                                 Medical Decision Making Amount and/or Complexity of Data Reviewed Labs: ordered.   Patient presented to the ED with requesting hCG repeat.  Evaluated previously on September 02, 2023 where she had an ultrasound which showed an IUP with a gestational age of [redacted] weeks and 6 days.  She tells me that she continued to bleed after this.  She does have an appointment with her PCP in order to establish care on Tuesday.  She was told by them to come to the ED in order to obtain hCG repeat.  She tells me that she is no longer bleeding, there is no abdominal cramping, no urinary symptoms.  She does not have any complaints at this time.  [place] IMPRESSION: Single intrauterine gestation at sonographic gestational age [redacted] weeks, 6 days. Fetal heart rate 160 beats per minute. EDD 04/14/2024.   hCG on today's visit is 64, I discussed with patient she likely miscarried when she told me she was passing large clots after her visit on September 02, 2023.  She is currently not having any symptoms.  Denies any fever.  Reports that her pregnancy symptoms such as nausea have dissipated in the past week.  She does have an appointment scheduled with OB/GYN.  Ultrasound was offered to patient, however she does have appropriate follow-up.  Shared decision making on following up with OB and no further emergent workup at this time.  No fever, no other complaints suspect retained products.  She is hemodynamically  stable for discharge.   Portions of this note were generated with Scientist, clinical (histocompatibility and immunogenetics). Dictation errors may occur despite best attempts at proofreading.   Final diagnoses:  Laboratory test    ED Discharge Orders     None          Maureen Broad, [place] 09/19/23 1034  Ruthe Cornet, DO 09/19/23 1142

## 2023-09-25 ENCOUNTER — Other Ambulatory Visit (HOSPITAL_COMMUNITY): Payer: PRIVATE HEALTH INSURANCE

## 2023-10-02 ENCOUNTER — Other Ambulatory Visit (HOSPITAL_COMMUNITY): Payer: PRIVATE HEALTH INSURANCE
